# Patient Record
Sex: Female | Born: 1995 | Race: White | Hispanic: No | Marital: Single | Smoking: Never smoker
Health system: Southern US, Community
[De-identification: ages and names within clinical notes are randomized; demographics above are authoritative.]

## PROBLEM LIST (undated history)

## (undated) DIAGNOSIS — R519 Headache, unspecified: Secondary | ICD-10-CM

## (undated) DIAGNOSIS — G43909 Migraine, unspecified, not intractable, without status migrainosus: Secondary | ICD-10-CM

## (undated) DIAGNOSIS — F32A Depression, unspecified: Secondary | ICD-10-CM

## (undated) DIAGNOSIS — J45909 Unspecified asthma, uncomplicated: Secondary | ICD-10-CM

## (undated) DIAGNOSIS — R51 Headache: Secondary | ICD-10-CM

## (undated) DIAGNOSIS — F909 Attention-deficit hyperactivity disorder, unspecified type: Secondary | ICD-10-CM

## (undated) DIAGNOSIS — F329 Major depressive disorder, single episode, unspecified: Secondary | ICD-10-CM

## (undated) HISTORY — DX: Major depressive disorder, single episode, unspecified: F32.9

## (undated) HISTORY — DX: Attention-deficit hyperactivity disorder, unspecified type: F90.9

## (undated) HISTORY — DX: Unspecified asthma, uncomplicated: J45.909

## (undated) HISTORY — DX: Depression, unspecified: F32.A

## (undated) HISTORY — DX: Headache: R51

## (undated) HISTORY — DX: Migraine, unspecified, not intractable, without status migrainosus: G43.909

## (undated) HISTORY — DX: Headache, unspecified: R51.9

---
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
## 2017-02-18 ENCOUNTER — Other Ambulatory Visit (INDEPENDENT_AMBULATORY_CARE_PROVIDER_SITE_OTHER): Payer: Self-pay

## 2017-02-18 ENCOUNTER — Encounter: Payer: Self-pay | Admitting: Nurse Practitioner

## 2017-02-18 ENCOUNTER — Ambulatory Visit (INDEPENDENT_AMBULATORY_CARE_PROVIDER_SITE_OTHER): Payer: 59 | Admitting: Nurse Practitioner

## 2017-02-18 VITALS — BP 122/84 | HR 79 | Temp 98.2°F | Ht 65.0 in | Wt 140.0 lb

## 2017-02-18 DIAGNOSIS — G43709 Chronic migraine without aura, not intractable, without status migrainosus: Secondary | ICD-10-CM | POA: Diagnosis not present

## 2017-02-18 DIAGNOSIS — Z0001 Encounter for general adult medical examination with abnormal findings: Secondary | ICD-10-CM

## 2017-02-18 DIAGNOSIS — J4520 Mild intermittent asthma, uncomplicated: Secondary | ICD-10-CM | POA: Diagnosis not present

## 2017-02-18 DIAGNOSIS — G43909 Migraine, unspecified, not intractable, without status migrainosus: Secondary | ICD-10-CM | POA: Insufficient documentation

## 2017-02-18 DIAGNOSIS — Z13220 Encounter for screening for lipoid disorders: Secondary | ICD-10-CM | POA: Diagnosis not present

## 2017-02-18 DIAGNOSIS — F332 Major depressive disorder, recurrent severe without psychotic features: Secondary | ICD-10-CM

## 2017-02-18 DIAGNOSIS — Z136 Encounter for screening for cardiovascular disorders: Secondary | ICD-10-CM

## 2017-02-18 DIAGNOSIS — J45909 Unspecified asthma, uncomplicated: Secondary | ICD-10-CM | POA: Insufficient documentation

## 2017-02-18 LAB — CBC
HCT: 43.6 % (ref 36.0–46.0)
HEMOGLOBIN: 14.8 g/dL (ref 12.0–15.0)
MCHC: 33.9 g/dL (ref 30.0–36.0)
MCV: 86.4 fl (ref 78.0–100.0)
Platelets: 285 10*3/uL (ref 150.0–400.0)
RBC: 5.05 Mil/uL (ref 3.87–5.11)
RDW: 13.4 % (ref 11.5–15.5)
WBC: 9.3 10*3/uL (ref 4.0–10.5)

## 2017-02-18 LAB — LIPID PANEL
Cholesterol: 161 mg/dL (ref 0–200)
HDL: 49.6 mg/dL (ref >39.00)
LDL Cholesterol: 97 mg/dL (ref 0–99)
NONHDL: 111.6
TRIGLYCERIDES: 75 mg/dL (ref 0.0–149.0)
Total CHOL/HDL Ratio: 3
VLDL: 15 mg/dL (ref 0.0–40.0)

## 2017-02-18 LAB — COMPREHENSIVE METABOLIC PANEL
ALT: 12 U/L (ref 0–35)
AST: 15 U/L (ref 0–37)
Albumin: 5 g/dL (ref 3.5–5.2)
Alkaline Phosphatase: 58 U/L (ref 39–117)
BUN: 12 mg/dL (ref 6–23)
CHLORIDE: 104 meq/L (ref 96–112)
CO2: 24 mEq/L (ref 19–32)
Calcium: 9.9 mg/dL (ref 8.4–10.5)
Creatinine, Ser: 0.78 mg/dL (ref 0.40–1.20)
GFR: 98.34 mL/min (ref >60.00)
GLUCOSE: 90 mg/dL (ref 70–99)
POTASSIUM: 3.7 meq/L (ref 3.5–5.1)
SODIUM: 138 meq/L (ref 135–145)
Total Bilirubin: 0.5 mg/dL (ref 0.2–1.2)
Total Protein: 7.6 g/dL (ref 6.0–8.3)

## 2017-02-18 LAB — TSH: TSH: 2.21 u[IU]/mL (ref 0.35–4.50)

## 2017-02-18 MED ORDER — CITALOPRAM HYDROBROMIDE 40 MG PO TABS: 40.0000 mg | ORAL_TABLET | Freq: Every day | ORAL | 3 refills | Status: DC

## 2017-02-18 NOTE — Patient Instructions (Signed)
I instructed pt to start 1/2 tablet once daily for 1 week and then increase to a full tablet once daily on week two as tolerated.   We discussed common side effects such as nausea, drowsiness and weight gain.  Also discussed rare but serious side effect of suicide ideation.  She is instructed to discontinue medication go directly to ED if this occurs.  Pt verbalizes understanding.   Plan follow up in 1 month to evaluate progress.   Sign medical release to get form from previous pcp.   Health Maintenance, Female Adopting a healthy lifestyle and getting preventive care can go a long way to promote health and wellness. Talk with your health care provider about what schedule of regular examinations is right for you. This is a good chance for you to check in with your provider about disease prevention and staying healthy. In between checkups, there are plenty of things you can do on your own. Experts have done a lot of research about which lifestyle changes and preventive measures are most likely to keep you healthy. Ask your health care provider for more information. Weight and diet Eat a healthy diet  Be sure to include plenty of vegetables, fruits, low-fat dairy products, and lean protein.  Do not eat a lot of foods high in solid fats, added sugars, or salt.  Get regular exercise. This is one of the most important things you can do for your health.  Most adults should exercise for at least 150 minutes each week. The exercise should increase your heart rate and make you sweat (moderate-intensity exercise).  Most adults should also do strengthening exercises at least twice a week. This is in addition to the moderate-intensity exercise. Maintain a healthy weight  Body mass index (BMI) is a measurement that can be used to identify possible weight problems. It estimates body fat based on height and weight. Your health care provider can help determine your BMI and help you achieve or maintain a  healthy weight.  For females 41 years of age and older:  A BMI below 18.5 is considered underweight.  A BMI of 18.5 to 24.9 is normal.  A BMI of 25 to 29.9 is considered overweight.  A BMI of 30 and above is considered obese. Watch levels of cholesterol and blood lipids  You should start having your blood tested for lipids and cholesterol at 22 years of age, then have this test every 5 years.  You may need to have your cholesterol levels checked more often if:  Your lipid or cholesterol levels are high.  You are older than 22 years of age.  You are at high risk for heart disease. Cancer screening Lung Cancer  Lung cancer screening is recommended for adults 49-60 years old who are at high risk for lung cancer because of a history of smoking.  A yearly low-dose CT scan of the lungs is recommended for people who:  Currently smoke.  Have quit within the past 15 years.  Have at least a 30-pack-year history of smoking. A pack year is smoking an average of one pack of cigarettes a day for 1 year.  Yearly screening should continue until it has been 15 years since you quit.  Yearly screening should stop if you develop a health problem that would prevent you from having lung cancer treatment. Breast Cancer  Practice breast self-awareness. This means understanding how your breasts normally appear and feel.  It also means doing regular breast self-exams. Let your health  care provider know about any changes, no matter how small.  If you are in your 20s or 30s, you should have a clinical breast exam (CBE) by a health care provider every 1-3 years as part of a regular health exam.  If you are 63 or older, have a CBE every year. Also consider having a breast X-ray (mammogram) every year.  If you have a family history of breast cancer, talk to your health care provider about genetic screening.  If you are at high risk for breast cancer, talk to your health care provider about having  an MRI and a mammogram every year.  Breast cancer gene (BRCA) assessment is recommended for women who have family members with BRCA-related cancers. BRCA-related cancers include:  Breast.  Ovarian.  Tubal.  Peritoneal cancers.  Results of the assessment will determine the need for genetic counseling and BRCA1 and BRCA2 testing. Cervical Cancer  Your health care provider may recommend that you be screened regularly for cancer of the pelvic organs (ovaries, uterus, and vagina). This screening involves a pelvic examination, including checking for microscopic changes to the surface of your cervix (Pap test). You may be encouraged to have this screening done every 3 years, beginning at age 24.  For women ages 32-65, health care providers may recommend pelvic exams and Pap testing every 3 years, or they may recommend the Pap and pelvic exam, combined with testing for human papilloma virus (HPV), every 5 years. Some types of HPV increase your risk of cervical cancer. Testing for HPV may also be done on women of any age with unclear Pap test results.  Other health care providers may not recommend any screening for nonpregnant women who are considered low risk for pelvic cancer and who do not have symptoms. Ask your health care provider if a screening pelvic exam is right for you.  If you have had past treatment for cervical cancer or a condition that could lead to cancer, you need Pap tests and screening for cancer for at least 20 years after your treatment. If Pap tests have been discontinued, your risk factors (such as having a new sexual partner) need to be reassessed to determine if screening should resume. Some women have medical problems that increase the chance of getting cervical cancer. In these cases, your health care provider may recommend more frequent screening and Pap tests. Colorectal Cancer  This type of cancer can be detected and often prevented.  Routine colorectal cancer screening  usually begins at 22 years of age and continues through 22 years of age.  Your health care provider may recommend screening at an earlier age if you have risk factors for colon cancer.  Your health care provider may also recommend using home test kits to check for hidden blood in the stool.  A small camera at the end of a tube can be used to examine your colon directly (sigmoidoscopy or colonoscopy). This is done to check for the earliest forms of colorectal cancer.  Routine screening usually begins at age 38.  Direct examination of the colon should be repeated every 5-10 years through 22 years of age. However, you may need to be screened more often if early forms of precancerous polyps or small growths are found. Skin Cancer  Check your skin from head to toe regularly.  Tell your health care provider about any new moles or changes in moles, especially if there is a change in a mole's shape or color.  Also tell your health  care provider if you have a mole that is larger than the size of a pencil eraser.  Always use sunscreen. Apply sunscreen liberally and repeatedly throughout the day.  Protect yourself by wearing long sleeves, pants, a wide-brimmed hat, and sunglasses whenever you are outside. Heart disease, diabetes, and high blood pressure  High blood pressure causes heart disease and increases the risk of stroke. High blood pressure is more likely to develop in:  People who have blood pressure in the high end of the normal range (130-139/85-89 mm Hg).  People who are overweight or obese.  People who are African American.  If you are 83-59 years of age, have your blood pressure checked every 3-5 years. If you are 17 years of age or older, have your blood pressure checked every year. You should have your blood pressure measured twice-once when you are at a hospital or clinic, and once when you are not at a hospital or clinic. Record the average of the two measurements. To check your  blood pressure when you are not at a hospital or clinic, you can use:  An automated blood pressure machine at a pharmacy.  A home blood pressure monitor.  If you are between 34 years and 36 years old, ask your health care provider if you should take aspirin to prevent strokes.  Have regular diabetes screenings. This involves taking a blood sample to check your fasting blood sugar level.  If you are at a normal weight and have a low risk for diabetes, have this test once every three years after 22 years of age.  If you are overweight and have a high risk for diabetes, consider being tested at a younger age or more often. Preventing infection Hepatitis B  If you have a higher risk for hepatitis B, you should be screened for this virus. You are considered at high risk for hepatitis B if:  You were born in a country where hepatitis B is common. Ask your health care provider which countries are considered high risk.  Your parents were born in a high-risk country, and you have not been immunized against hepatitis B (hepatitis B vaccine).  You have HIV or AIDS.  You use needles to inject street drugs.  You live with someone who has hepatitis B.  You have had sex with someone who has hepatitis B.  You get hemodialysis treatment.  You take certain medicines for conditions, including cancer, organ transplantation, and autoimmune conditions. Hepatitis C  Blood testing is recommended for:  Everyone born from [birth year] through 1966.  Anyone with known risk factors for hepatitis C. Sexually transmitted infections (STIs)  You should be screened for sexually transmitted infections (STIs) including gonorrhea and chlamydia if:  You are sexually active and are younger than 22 years of age.  You are older than 22 years of age and your health care provider tells you that you are at risk for this type of infection.  Your sexual activity has changed since you were last screened and you are at an  increased risk for chlamydia or gonorrhea. Ask your health care provider if you are at risk.  If you do not have HIV, but are at risk, it may be recommended that you take a prescription medicine daily to prevent HIV infection. This is called pre-exposure prophylaxis (PrEP). You are considered at risk if:  You are sexually active and do not regularly use condoms or know the HIV status of your partner(s).  You take drugs by  injection.  You are sexually active with a partner who has HIV. Talk with your health care provider about whether you are at high risk of being infected with HIV. If you choose to begin PrEP, you should first be tested for HIV. You should then be tested every 3 months for as long as you are taking PrEP. Pregnancy  If you are premenopausal and you may become pregnant, ask your health care provider about preconception counseling.  If you may become pregnant, take 400 to 800 micrograms (mcg) of folic acid every day.  If you want to prevent pregnancy, talk to your health care provider about birth control (contraception). Osteoporosis and menopause  Osteoporosis is a disease in which the bones lose minerals and strength with aging. This can result in serious bone fractures. Your risk for osteoporosis can be identified using a bone density scan.  If you are 69 years of age or older, or if you are at risk for osteoporosis and fractures, ask your health care provider if you should be screened.  Ask your health care provider whether you should take a calcium or vitamin D supplement to lower your risk for osteoporosis.  Menopause may have certain physical symptoms and risks.  Hormone replacement therapy may reduce some of these symptoms and risks. Talk to your health care provider about whether hormone replacement therapy is right for you. Follow these instructions at home:  Schedule regular health, dental, and eye exams.  Stay current with your immunizations.  Do not use  any tobacco products including cigarettes, chewing tobacco, or electronic cigarettes.  If you are pregnant, do not drink alcohol.  If you are breastfeeding, limit how much and how often you drink alcohol.  Limit alcohol intake to no more than 1 drink per day for nonpregnant women. One drink equals 12 ounces of beer, 5 ounces of wine, or 1 ounces of hard liquor.  Do not use street drugs.  Do not share needles.  Ask your health care provider for help if you need support or information about quitting drugs.  Tell your health care provider if you often feel depressed.  Tell your health care provider if you have ever been abused or do not feel safe at home. This information is not intended to replace advice given to you by your health care provider. Make sure you discuss any questions you have with your health care provider. Document Released: 04/13/2011 Document Revised: 03/05/2016 Document Reviewed: 07/02/2015 Elsevier Interactive Patient Education  2017 [employer].
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
## 2017-02-18 NOTE — Progress Notes (Signed)
Pre visit review using our clinic review tool, if applicable. No additional management support is needed unless otherwise documented below in the visit note. 

## 2017-03-04 ENCOUNTER — Telehealth: Payer: Self-pay | Admitting: Nurse Practitioner

## 2017-03-04 NOTE — Telephone Encounter (Signed)
ROI fax to [facility]

## 2017-03-18 ENCOUNTER — Encounter: Payer: Self-pay | Admitting: Nurse Practitioner

## 2017-03-18 ENCOUNTER — Ambulatory Visit (INDEPENDENT_AMBULATORY_CARE_PROVIDER_SITE_OTHER): Payer: 59 | Admitting: Nurse Practitioner

## 2017-03-18 VITALS — BP 98/74 | HR 85 | Temp 97.9°F | Ht 65.0 in | Wt 138.0 lb

## 2017-03-18 DIAGNOSIS — Z23 Encounter for immunization: Secondary | ICD-10-CM

## 2017-03-18 DIAGNOSIS — G478 Other sleep disorders: Secondary | ICD-10-CM | POA: Diagnosis not present

## 2017-03-18 DIAGNOSIS — F332 Major depressive disorder, recurrent severe without psychotic features: Secondary | ICD-10-CM | POA: Diagnosis not present

## 2017-03-18 DIAGNOSIS — F411 Generalized anxiety disorder: Secondary | ICD-10-CM | POA: Diagnosis not present

## 2017-03-18 MED ORDER — CITALOPRAM HYDROBROMIDE 40 MG PO TABS: 40.0000 mg | ORAL_TABLET | Freq: Every day | ORAL | 1 refills | Status: DC

## 2017-03-18 MED ORDER — TEMAZEPAM 7.5 MG PO CAPS: 7.5000 mg | ORAL_CAPSULE | Freq: Every evening | ORAL | 0 refills | Status: DC | PRN

## 2017-03-18 NOTE — Patient Instructions (Addendum)
You will be contacted to schedule appt with behavioral health.  Insomnia Insomnia is a sleep disorder that makes it difficult to fall asleep or to stay asleep. Insomnia can cause tiredness (fatigue), low energy, difficulty concentrating, mood swings, and poor performance at work or school. There are three different ways to classify insomnia:  Difficulty falling asleep.  Difficulty staying asleep.  Waking up too early in the morning.  Any type of insomnia can be long-term (chronic) or short-term (acute). Both are common. Short-term insomnia usually lasts for three months or less. Chronic insomnia occurs at least three times a week for longer than three months. What are the causes? Insomnia may be caused by another condition, situation, or substance, such as:  Anxiety.  Certain medicines.  Gastroesophageal reflux disease (GERD) or other gastrointestinal conditions.  Asthma or other breathing conditions.  Restless legs syndrome, sleep apnea, or other sleep disorders.  Chronic pain.  Menopause. This may include hot flashes.  Stroke.  Abuse of alcohol, tobacco, or illegal drugs.  Depression.  Caffeine.  Neurological disorders, such as Alzheimer disease.  An overactive thyroid (hyperthyroidism).  The cause of insomnia may not be known. What increases the risk? Risk factors for insomnia include:  Gender. Women are more commonly affected than men.  Age. Insomnia is more common as you get older.  Stress. This may involve your professional or personal life.  Income. Insomnia is more common in people with lower income.  Lack of exercise.  Irregular work schedule or night shifts.  Traveling between different time zones.  What are the signs or symptoms? If you have insomnia, trouble falling asleep or trouble staying asleep is the main symptom. This may lead to other symptoms, such as:  Feeling fatigued.  Feeling nervous about going to sleep.  Not feeling rested  in the morning.  Having trouble concentrating.  Feeling irritable, anxious, or depressed.  How is this treated? Treatment for insomnia depends on the cause. If your insomnia is caused by an underlying condition, treatment will focus on addressing the condition. Treatment may also include:  Medicines to help you sleep.  Counseling or therapy.  Lifestyle adjustments.  Follow these instructions at home:  Take medicines only as directed by your health care provider.  Keep regular sleeping and waking hours. Avoid naps.  Keep a sleep diary to help you and your health care provider figure out what could be causing your insomnia. Include: ? When you sleep. ? When you wake up during the night. ? How well you sleep. ? How rested you feel the next day. ? Any side effects of medicines you are taking. ? What you eat and drink.  Make your bedroom a comfortable place where it is easy to fall asleep: ? Put up shades or special blackout curtains to block light from outside. ? Use a white noise machine to block noise. ? Keep the temperature cool.  Exercise regularly as directed by your health care provider. Avoid exercising right before bedtime.  Use relaxation techniques to manage stress. Ask your health care provider to suggest some techniques that may work well for you. These may include: ? Breathing exercises. ? Routines to release muscle tension. ? Visualizing peaceful scenes.  Cut back on alcohol, caffeinated beverages, and cigarettes, especially close to bedtime. These can disrupt your sleep.  Do not overeat or eat spicy foods right before bedtime. This can lead to digestive discomfort that can make it hard for you to sleep.  Limit screen use before bedtime.  This includes: ? Watching TV. ? Using your smartphone, tablet, and computer.  Stick to a routine. This can help you fall asleep faster. Try to do a quiet activity, brush your teeth, and go to bed at the same time each  night.  Get out of bed if you are still awake after 15 minutes of trying to sleep. Keep the lights down, but try reading or doing a quiet activity. When you feel sleepy, go back to bed.  Make sure that you drive carefully. Avoid driving if you feel very sleepy.  Keep all follow-up appointments as directed by your health care provider. This is important. Contact a health care provider if:  You are tired throughout the day or have trouble in your daily routine due to sleepiness.  You continue to have sleep problems or your sleep problems get worse. Get help right away if:  You have serious thoughts about hurting yourself or someone else. This information is not intended to replace advice given to you by your health care provider. Make sure you discuss any questions you have with your health care provider. Document Released: 09/25/2000 Document Revised: 02/28/2016 Document Reviewed: 06/29/2014 Elsevier Interactive Patient Education  [employer].

## 2017-03-18 NOTE — Telephone Encounter (Signed)
Rec'd from [place] forward 31 pages to [facility] NP

## 2017-03-18 NOTE — Progress Notes (Signed)
Subjective:  Patient ID: Shannon Clay, female    DOB: [birth year]  Age: 21 y.o. MRN: [id number]  CC: Follow-up (1 mo fu/med is good--sleepless/pt stated she wants to [place] [facility] scrip now/tdap?)   HPI   Insomnia: Ongoing for several years. No improvement with melatonin in past. Sleeps 8hrs. Takes 1hour to fall asleep. Wakes up at least 2/3times a night.  Anxiety, Depression: Reports improved mood with celexa. Denies any adverse side effects. States she still thinks about death, but has no intention of hurting herself or another person. States the thought of death is not daily, nor is it lingering as before.   Outpatient Medications Prior to Visit  Medication Sig Dispense Refill  . citalopram (CELEXA) 40 MG tablet Take 1 tablet (40 mg total) by mouth daily. 30 tablet 3   No facility-administered medications prior to visit.     ROS See HPI  Objective:  BP 98/74   Pulse 85   Temp 97.9 F (36.6 C)   Ht 5\' 5"  (1.651 m)   Wt 138 lb (62.6 kg)   SpO2 99%   BMI 22.96 kg/m   BP Readings from Last 3 Encounters:  03/18/17 98/74  02/18/17 122/84    Wt Readings from Last 3 Encounters:  03/18/17 138 lb (62.6 kg)  02/18/17 140 lb (63.5 kg)    Physical Exam  Constitutional: She is oriented to person, place, and time.  Neck: Normal range of motion. Neck supple. No thyromegaly present.  Cardiovascular: Normal rate, regular rhythm and normal heart sounds.   Pulmonary/Chest: Effort normal and breath sounds normal.  Abdominal: Soft.  Musculoskeletal: Normal range of motion.  Lymphadenopathy:    She has no cervical adenopathy.  Neurological: She is alert and oriented to person, place, and time.  Vitals reviewed.   Lab Results  Component Value Date   WBC 9.3 02/18/2017   HGB 14.8 02/18/2017   HCT 43.6 02/18/2017   PLT 285.0 02/18/2017   GLUCOSE 90 02/18/2017   CHOL 161 02/18/2017   TRIG 75.0 02/18/2017   HDL 49.60 02/18/2017   LDLCALC 97 02/18/2017   ALT 12  02/18/2017   AST 15 02/18/2017   NA 138 02/18/2017   K 3.7 02/18/2017   CL 104 02/18/2017   CREATININE 0.78 02/18/2017   BUN 12 02/18/2017   CO2 24 02/18/2017   TSH 2.21 02/18/2017    No results found.  Assessment & Plan:   Shannon Clay was seen today for follow-up.  Diagnoses and all orders for this visit:  GAD (generalized anxiety disorder) -     Ambulatory referral to Psychiatry -     temazepam (RESTORIL) 7.5 MG capsule; Take 1 capsule (7.5 mg total) by mouth at bedtime as needed for sleep.  Non-restorative sleep -     temazepam (RESTORIL) 7.5 MG capsule; Take 1 capsule (7.5 mg total) by mouth at bedtime as needed for sleep.  Severe episode of recurrent major depressive disorder, without psychotic features ([place]) -     citalopram (CELEXA) 40 MG tablet; Take 1 tablet (40 mg total) by mouth daily. -     Ambulatory referral to Psychiatry  Need for diphtheria-tetanus-pertussis (Tdap) vaccine -     Tdap vaccine greater than or equal to 7yo IM   I am having Shannon Clay start on temazepam. I am also having her maintain her citalopram.  Meds ordered this encounter  Medications  . citalopram (CELEXA) 40 MG tablet    Sig: Take 1 tablet (40 mg total)  by mouth daily.    Dispense:  90 tablet    Refill:  1    Order Specific Question:   Supervising Provider    Answer:   Cassandria Anger [[id number]]  . temazepam (RESTORIL) 7.5 MG capsule    Sig: Take 1 capsule (7.5 mg total) by mouth at bedtime as needed for sleep.    Dispense:  30 capsule    Refill:  0    Order Specific Question:   Supervising Provider    Answer:   Cassandria Anger [[id number]]    Follow-up: Return in about 4 weeks (around 04/15/2017) for insomnia and anxiety.Wilfred Lacy, NP

## 2017-04-19 ENCOUNTER — Ambulatory Visit: Payer: 59 | Admitting: Nurse Practitioner

## 2017-05-13 ENCOUNTER — Encounter: Payer: Self-pay | Admitting: Neurology

## 2017-05-14 ENCOUNTER — Encounter: Payer: Self-pay | Admitting: Family

## 2017-05-14 ENCOUNTER — Ambulatory Visit: Payer: 59 | Admitting: Family

## 2017-05-14 ENCOUNTER — Ambulatory Visit (INDEPENDENT_AMBULATORY_CARE_PROVIDER_SITE_OTHER): Payer: 59 | Admitting: Family

## 2017-05-14 DIAGNOSIS — S91331A Puncture wound without foreign body, right foot, initial encounter: Secondary | ICD-10-CM | POA: Diagnosis not present

## 2017-05-14 DIAGNOSIS — G478 Other sleep disorders: Secondary | ICD-10-CM

## 2017-05-14 DIAGNOSIS — S91339A Puncture wound without foreign body, unspecified foot, initial encounter: Secondary | ICD-10-CM | POA: Insufficient documentation

## 2017-05-14 MED ORDER — ESZOPICLONE 1 MG PO TABS: 1.0000 mg | ORAL_TABLET | Freq: Every evening | ORAL | 0 refills | Status: DC | PRN

## 2017-05-14 NOTE — Progress Notes (Signed)
Subjective:    Patient ID: Shannon Clay, female    DOB: 1996-08-13, 21 y.o.   MRN: [id number]  Chief Complaint  Patient presents with  . Acute Visit    stepped on a piece of medal at work, was bare foot and punctured right foot     HPI:  Shannon Clay is a 21 y.o. female who  has a past medical history of ADHD (attention deficit hyperactivity disorder); Asthma; Depression; Frequent headaches; and Migraine. and presents today for an acute office visit.   1.) Puncture wound - This is a new problem. Associated symptom of a wound located on his right foot has occurred when he stepped on a piece of metal at work that occurred about 24 hours ago. Describes that he was barefoot and the metal punctured his foot. Denies fevers. Cleansed with soap and water. Does have some pain.  2.) Sleep disturbance - Previously prescribed temazepam. Reports medication as prescribed and notes significant grogginess and sleepiness upon waking in the mornings. Previous treatment failures with over-the-counter melatonin and Tylenol PM. Believes anxiety may be the root cause of symptoms.  Allergies  Allergen Reactions  . Sulfa Antibiotics       Outpatient Medications Prior to Visit  Medication Sig Dispense Refill  . citalopram (CELEXA) 40 MG tablet Take 1 tablet (40 mg total) by mouth daily. 90 tablet 1  . temazepam (RESTORIL) 7.5 MG capsule Take 1 capsule (7.5 mg total) by mouth at bedtime as needed for sleep. (Patient not taking: Reported on 05/14/2017) 30 capsule 0   No facility-administered medications prior to visit.       No past surgical history on file.    Past Medical History:  Diagnosis Date  . ADHD (attention deficit hyperactivity disorder)   . Asthma   . Depression   . Frequent headaches   . Migraine       Review of Systems  Constitutional: Negative for chills and fever.  Skin: Positive for wound. Negative for rash.  Neurological: Negative for weakness and numbness.        Objective:    BP 120/70 (BP Location: Left Arm, Patient Position: Sitting, Cuff Size: Normal)   Pulse 75   Temp 98.4 F (36.9 C) (Oral)   Resp 12   Ht 5\' 5"  (1.651 m)   Wt 142 lb (64.4 kg)   SpO2 99%   BMI 23.63 kg/m  Nursing note and vital signs reviewed.  Physical Exam  Constitutional: She is oriented to person, place, and time. She appears well-developed and well-nourished. No distress.  Cardiovascular: Normal rate, regular rhythm, normal heart sounds and intact distal pulses.   Pulmonary/Chest: Effort normal and breath sounds normal.  Neurological: She is alert and oriented to person, place, and time.  Skin: Skin is warm and dry.  3-4 mm annular puncture wound noted on the planter aspect of the right heel. Wound examined with no evidence of foreign body or material. No redness or edema. Mild tenderness.   Psychiatric: She has a normal mood and affect. Her behavior is normal. Judgment and thought content normal.       Assessment & Plan:   Problem List Items Addressed This Visit      Nervous and Auditory   Non-restorative sleep    Continues to experience non-restorative sleep with temazepam resulting in groggy feelings in the morning. Discontinue temazepam. Trial Lunesta. Encouraged good sleep hygiene. Follow up pending trail of medication.  Other   Puncture wound of heel    New onset puncture wound of heel with no evidence of foreign body or material. No evidence of infection. Tetanus is up to date. Recommend basic wound care with soap and water. Cover for protection. Follow up for signs of infection.           I have discontinued Ms. Behlke's temazepam. I am also having her start on eszopiclone. Additionally, I am having her maintain her citalopram.   Meds ordered this encounter  Medications  . eszopiclone (LUNESTA) 1 MG TABS tablet    Sig: Take 1 tablet (1 mg total) by mouth at bedtime as needed for sleep. Take immediately before bedtime    Dispense:  14  tablet    Refill:  0    Order Specific Question:   Supervising Provider    Answer:   Pricilla Holm A [[id number]]     Follow-up: Return in about 1 month (around 06/14/2017), or if symptoms worsen or fail to improve.  Mauricio Po, FNP

## 2017-05-14 NOTE — Assessment & Plan Note (Signed)
New onset puncture wound of heel with no evidence of foreign body or material. No evidence of infection. Tetanus is up to date. Recommend basic wound care with soap and water. Cover for protection. Follow up for signs of infection.

## 2017-05-14 NOTE — Assessment & Plan Note (Signed)
Continues to experience non-restorative sleep with temazepam resulting in groggy feelings in the morning. Discontinue temazepam. Trial Lunesta. Encouraged good sleep hygiene. Follow up pending trail of medication.

## 2017-05-14 NOTE — Patient Instructions (Signed)
Thank you for choosing [facility].  SUMMARY AND INSTRUCTIONS:  Keep clean with soap and water.  Cover with bandage.  Donut pad as needed.   Follow up with signs of infection.   Follow up:  If your symptoms worsen or fail to improve, please contact our office for further instruction, or in case of emergency go directly to the emergency room at the closest medical facility.

## 2017-05-17 ENCOUNTER — Ambulatory Visit (INDEPENDENT_AMBULATORY_CARE_PROVIDER_SITE_OTHER): Payer: 59 | Admitting: Neurology

## 2017-05-17 ENCOUNTER — Encounter: Payer: Self-pay | Admitting: Neurology

## 2017-05-17 VITALS — BP 108/69 | HR 76 | Ht 65.0 in | Wt 142.0 lb

## 2017-05-17 DIAGNOSIS — G478 Other sleep disorders: Secondary | ICD-10-CM

## 2017-05-17 DIAGNOSIS — F1510 Other stimulant abuse, uncomplicated: Secondary | ICD-10-CM

## 2017-05-17 DIAGNOSIS — G47411 Narcolepsy with cataplexy: Secondary | ICD-10-CM | POA: Diagnosis not present

## 2017-05-17 DIAGNOSIS — R441 Visual hallucinations: Secondary | ICD-10-CM | POA: Diagnosis not present

## 2017-05-17 DIAGNOSIS — F518 Other sleep disorders not due to a substance or known physiological condition: Secondary | ICD-10-CM

## 2017-05-17 DIAGNOSIS — G4719 Other hypersomnia: Secondary | ICD-10-CM | POA: Diagnosis not present

## 2017-05-17 DIAGNOSIS — G4759 Other parasomnia: Secondary | ICD-10-CM | POA: Insufficient documentation

## 2017-05-17 MED ORDER — AMPHETAMINE-DEXTROAMPHETAMINE 10 MG PO TABS: 10.0000 mg | ORAL_TABLET | Freq: Every day | ORAL | 0 refills | Status: DC

## 2017-05-17 NOTE — Patient Instructions (Addendum)
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 We may have to try different medications that may help you stay awake during the day. Not everything works with everybody the same way. Wake promoting agents include stimulants and non-stimulant type medications. The most common side effects with stimulants are weight loss, insomnia, nervousness, headaches, palpitations, rise in blood pressure, anxiety. Stimulants can be addictive and subject to abuse. Non-stimulant type wake promoting medications include Provigil and Nuvigil, most common side effects include headaches, nervousness, insomnia, hypertension. In addition there is a medication called Xyrem which has been proven to be very effective in patients with narcolepsy with or without cataplexy. Some patients with narcolepsy report episodes of weakness, such as jaw or facial weakness, legs giving out, feeling wobbly or like "Jell-o", etc. in situations of anxiety, stress, laughter, sudden sadness, surprise, etc., which is called cataplexy. You can also experience episodes of sleep paralysis during which you may feel unable to move upon awakening. Some people experience dreamlike sequences upon awakening or upon drifting off to sleep, called hypnopompic or hypnagogic hallucinations.  Weaning schedule for MSLT _PSG after weaning off Citalopram. Follow with MSLT.  Weaning schedule - go to 20 mg citalopram for 1 week daily, than 10 mg for one week. Than off for 14 days. MSLT in mid to late September.
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
## 2017-05-17 NOTE — Addendum Note (Signed)
Addended by: Larey Seat on: 05/17/2017 09:54 AM   Modules accepted: Orders

## 2017-06-22 ENCOUNTER — Telehealth: Payer: Self-pay | Admitting: Nurse Practitioner

## 2017-06-22 NOTE — Telephone Encounter (Signed)
Patients mother called asking if any of our MDs do hormone therapy for transgenders. Jonelle Sidle informed me Sharlet Salina and Ronnald Ramp do. The mother would like to know if there are any special requirements expected I.e a psychiatrists, involved or anything like that from the MDs. They would prefer to not get a psychiatrists because of money. Also the patient was told by Baldo Ash she does not do this so they would like to know if you will except the patient as a new patient.  Please advise. She wanted me to ask both MDs and see what both responses were.

## 2017-06-23 NOTE — Telephone Encounter (Signed)
I do supervise and initiate hormonal therapy for patient and would be willing to accept as transfer.

## 2017-06-28 NOTE — Telephone Encounter (Signed)
Mom would like to know if you will need a letter from a psychiatrists and she would also like to know the coding codes for this so she can call her insurance company, please advise.

## 2017-06-29 NOTE — Telephone Encounter (Signed)
They can call insurance company to find out which codes are covered and we can work with that. I'm not sure what note she is talking about from psychiatrist.

## 2017-07-02 NOTE — Telephone Encounter (Signed)
LVM with mother informing of response.

## 2017-07-14 ENCOUNTER — Ambulatory Visit (INDEPENDENT_AMBULATORY_CARE_PROVIDER_SITE_OTHER): Payer: 59 | Admitting: Internal Medicine

## 2017-07-14 ENCOUNTER — Encounter (INDEPENDENT_AMBULATORY_CARE_PROVIDER_SITE_OTHER): Payer: 59 | Admitting: Neurology

## 2017-07-14 ENCOUNTER — Encounter: Payer: Self-pay | Admitting: Internal Medicine

## 2017-07-14 VITALS — BP 110/70 | HR 64 | Temp 98.5°F | Ht 65.0 in | Wt 146.0 lb

## 2017-07-14 DIAGNOSIS — G4719 Other hypersomnia: Secondary | ICD-10-CM

## 2017-07-14 DIAGNOSIS — Z23 Encounter for immunization: Secondary | ICD-10-CM | POA: Diagnosis not present

## 2017-07-14 DIAGNOSIS — F518 Other sleep disorders not due to a substance or known physiological condition: Secondary | ICD-10-CM

## 2017-07-14 DIAGNOSIS — G4733 Obstructive sleep apnea (adult) (pediatric): Secondary | ICD-10-CM

## 2017-07-14 DIAGNOSIS — F1510 Other stimulant abuse, uncomplicated: Secondary | ICD-10-CM

## 2017-07-14 DIAGNOSIS — G47411 Narcolepsy with cataplexy: Secondary | ICD-10-CM

## 2017-07-14 DIAGNOSIS — R441 Visual hallucinations: Secondary | ICD-10-CM

## 2017-07-14 DIAGNOSIS — G4759 Other parasomnia: Secondary | ICD-10-CM

## 2017-07-14 DIAGNOSIS — E349 Endocrine disorder, unspecified: Secondary | ICD-10-CM | POA: Diagnosis not present

## 2017-07-14 DIAGNOSIS — G478 Other sleep disorders: Secondary | ICD-10-CM

## 2017-07-14 MED ORDER — TESTOSTERONE CYPIONATE 200 MG/ML IM SOLN: 100.0000 mg | INTRAMUSCULAR | 0 refills | Status: DC

## 2017-07-14 NOTE — Patient Instructions (Signed)
We have given you the prescription today for the testosterone.   Take 1/2 mL into your muscle once a week for the first month.  We will see you back in about 1 month to check the labs and after that it will be every 2 weeks.

## 2017-07-14 NOTE — Progress Notes (Signed)
   Subjective:    Patient ID: Shannon Clay, female    DOB: 09/02/1996, 21 y.o.   MRN: [id number]  HPI The patient is a 21 YO female to female transgender coming in for new patient initiation of hormone therapy. Lives with family who are supportive. Has employment. Has known for a long time that he was not comfortable in his body and finally realized the gender dysphoria. He is presenting as female but feels that his appearance is not female and wants to go on hormones. Does have some sleeping problems. Some depression with appearance and the fact that people do not see him as female. Denies SI/HI.   PMH, [facility], social history reviewed and updated.   Review of Systems  Constitutional: Negative.   HENT: Negative.   Eyes: Negative.   Respiratory: Negative for cough, chest tightness and shortness of breath.   Cardiovascular: Negative for chest pain, palpitations and leg swelling.  Gastrointestinal: Negative for abdominal distention, abdominal pain, constipation, diarrhea, nausea and vomiting.  Musculoskeletal: Negative.   Skin: Negative.   Neurological: Negative.   Psychiatric/Behavioral: Positive for sleep disturbance. Negative for agitation, behavioral problems, confusion, decreased concentration, dysphoric mood, hallucinations, self-injury and suicidal ideas. The patient is not hyperactive.       Objective:   Physical Exam  Constitutional: She is oriented to person, place, and time. She appears well-developed and well-nourished.  HENT:  Head: Normocephalic and atraumatic.  Eyes: EOM are normal.  Neck: Normal range of motion.  Cardiovascular: Normal rate and regular rhythm.   Pulmonary/Chest: Effort normal and breath sounds normal. No respiratory distress. She has no wheezes. She has no rales.  Abdominal: Soft. Bowel sounds are normal. She exhibits no distension. There is no tenderness. There is no rebound.  Musculoskeletal: She exhibits no edema.  Neurological: She is alert and oriented to  person, place, and time. Coordination normal.  Skin: Skin is warm and dry.  Psychiatric: She has a normal mood and affect.   Vitals:   07/14/17 1040  BP: 110/70  Pulse: 64  Temp: 98.5 F (36.9 C)  TempSrc: Oral  SpO2: 100%  Weight: 146 lb (66.2 kg)  Height: 5\' 5"  (1.651 m)      Assessment & Plan:  Flu shot given at visit.

## 2017-07-15 ENCOUNTER — Ambulatory Visit (INDEPENDENT_AMBULATORY_CARE_PROVIDER_SITE_OTHER): Payer: 59 | Admitting: Neurology

## 2017-07-15 DIAGNOSIS — F1510 Other stimulant abuse, uncomplicated: Secondary | ICD-10-CM

## 2017-07-15 DIAGNOSIS — G47411 Narcolepsy with cataplexy: Secondary | ICD-10-CM | POA: Diagnosis not present

## 2017-07-15 DIAGNOSIS — G478 Other sleep disorders: Secondary | ICD-10-CM

## 2017-07-15 DIAGNOSIS — F518 Other sleep disorders not due to a substance or known physiological condition: Secondary | ICD-10-CM

## 2017-07-15 DIAGNOSIS — G4719 Other hypersomnia: Secondary | ICD-10-CM

## 2017-07-15 DIAGNOSIS — R441 Visual hallucinations: Secondary | ICD-10-CM

## 2017-07-15 DIAGNOSIS — G4759 Other parasomnia: Secondary | ICD-10-CM

## 2017-07-16 DIAGNOSIS — E349 Endocrine disorder, unspecified: Secondary | ICD-10-CM | POA: Insufficient documentation

## 2017-07-16 NOTE — Assessment & Plan Note (Signed)
Rx for testosterone 100 mg weekly for 1 month. Then needs visit with labs. Adjust dosing at that time and then monitoring q 3 months for 1 year then q 6 months. Discussed community resources for support including [facility]. Work environment is supportive and family supportive which is important.

## 2017-07-19 ENCOUNTER — Telehealth: Payer: Self-pay | Admitting: Neurology

## 2017-07-19 NOTE — Telephone Encounter (Signed)
-----   Message from Larey Seat, MD sent at 07/19/2017 10:19 AM EDT ----- Normal PSG followed by MSLT. MSLT was not normal and provided one REM sleep in 5 naps and overall sleep latency was very short ( MSL) . This patient needs treatment for Narcolepsy.   Cc Chevis Pretty, DDS

## 2017-07-19 NOTE — Telephone Encounter (Signed)
Called to discuss sleep study results. No answer and patient voice mail box was full and was unable to leave a message

## 2017-07-19 NOTE — Procedures (Signed)
PATIENT'S NAME:  Shannon Clay, Capano DOB:      1996-07-21      MR#:    [id number]     DATE OF RECORDING: 07/14/2017 REFERRING M.D.:  Chevis Pretty, DDS Study Performed:   Baseline Polysomnogram HISTORY:  Excessive daytime sleepiness, Sleep paralysis, vivid dreams, ADHD, Asthma, Depression, Migraine, Frequent Headaches. The patient endorsed the Epworth Sleepiness Scale at 17/24 points and the FSS at 48.   The patient's weight 142 pounds with a height of 65 (inches), resulting in a BMI of 23.5 kg/m2. The patient's neck circumference measured 13.8 inches.  CURRENT MEDICATIONS: Celexa.   PROCEDURE:  This is a multichannel digital polysomnogram utilizing the Somnostar 11.2 system.  Electrodes and sensors were applied and monitored per AASM Specifications.   EEG, EOG, Chin and Limb EMG, were sampled at 200 Hz.  ECG, Snore and Nasal Pressure, Thermal Airflow, Respiratory Effort, CPAP Flow and Pressure, Oximetry was sampled at 50 Hz. Digital video and audio were recorded.      BASELINE STUDY Lights Out was at 20:47 and Lights On at 05:31.  Total recording time (TRT) was 524.5 minutes, with a total sleep time (TST) of 490.5 minutes.   The patient's sleep latency was 7.5 minutes.  REM latency was 45 minutes.  The sleep efficiency was 93.5 %.     SLEEP ARCHITECTURE: WASO (Wake after sleep onset) was 26 minutes.  There were 11 minutes in Stage N1, 175 minutes Stage N2, 153 minutes Stage N3 and 151.5 minutes in Stage REM.  The percentage of Stage N1 was 2.2%, Stage N2 was 35.7%, Stage N3 was 31.2% and Stage R (REM sleep) was 30.9%.  RESPIRATORY ANALYSIS:  There were a total of 8 respiratory events:  1 obstructive apnea, 2 central apneas and 0 mixed apneas with 5 hypopneas. The patient also had 0 respiratory event related arousals (RERAs).    The total APNEA/HYPOPNEA INDEX (AHI) was 1.0/hour and the total RESPIRATORY DISTURBANCE INDEX was 1.0 /hour.  5 events occurred in REM sleep and 6 events in NREM. The REM AHI  was 2.0 /hour, versus a non-REM AHI of 0.5. The patient spent 490.5 minutes of total sleep time in the supine position and 0 minutes in non-supine. The supine AHI was 1.0 versus a non-supine AHI of 0.0.  OXYGEN SATURATION & C02:  The Wake baseline 02 saturation was 98%, with the lowest being 94%. Time spent below 89% saturation equaled 0 minutes.    PERIODIC LIMB MOVEMENTS:  The patient had a total of 12 Periodic Limb Movements.  The Periodic Limb Movement (PLM) index was 1.5 and the PLM Arousal index was 0.2/hour. The arousals were noted as: 30 were spontaneous, 2 were associated with PLMs, and 3 were associated with respiratory events. Audio and video analysis did not show any abnormal or unusual movements, behaviors, phonations or vocalizations.   EKG was in keeping with normal sinus rhythm (NSR).  Post-study, the patient indicated that sleep was the same as usual.    IMPRESSION: Valid study for MSLT to follow.   1. A follow up appointment will be scheduled in the Sleep Clinic at [facility]. The referring provider will be notified of the results.      I certify that I have reviewed the entire raw data recording prior to the issuance of this report in accordance with the Standards of Accreditation of the [place] [employer] of [employer] Medicine (AASM)      Larey Seat, MD     07-19-2017  Diplomat, Tax adviser of Psychiatry and Neurology  Diplomat, Tax adviser of Sleep Medicine Market researcher, [place] [facility]   Name:  Shannon, Clay Reference [id number]  Study Date: 07/15/2017 Procedure #: [id number]  DOB: 1996/03/27    Protocol: This is a 13 channel Multiple Sleep Latency Test comprised of 5 channels of EEG (T3-Cz, Cz-T4, F4-M1, C4-M1, and O2-M1), 3 channels of Chin EMG, 4 channels of EOG and 1 channel for ECG.   All channels were sampled at 256 hz.  This polysomnographic procedure is designed to evaluate (1) the complaint of excessive daytime sleepiness by  quantifying the time required to fall asleep and (2) the possibility of narcolepsy by checking for abnormally short latencies to REM sleep.  Electrographic variables include EEG, EMG, EOG and ECG.  Patients are monitored throughout four or five 20-minute opportunities to sleep (naps) at two-hour intervals.  For each nap, the patient is allowed 20 minutes to fall asleep.  Once asleep, the patient is awakened after 15 minutes.  Between naps, the patient is kept as alert as possible.  A sleep latency of 20 minutes indicates that no sleep occurred.  Parametric Analysis  Total Number of Naps 5     NAP # Time of Nap  Sleep Latency (mins) REM Latency (mins) Sleep Time Percent Awake Time Percent  1 07:00 7.5 0 68 32   2 08:58 5 0 75  25   3 10:58 2.5 3 85  15   4 12:58 5 0 74  26   5 14:59 3 0 84  16    MSLT Summary of Naps  Sleepiness Index: 77  Mean Sleep Latency to all Five Naps: 4.6  Mean Sleep Latency to First Four Naps: 5  Mean Sleep Latency to First Three Naps: 5  Mean Sleep Latency to First Two Naps: 6.25  Number of Naps with REM Sleep: 1    Results from Preceding PSG Study  Sleep Onset Time 2054 Sleep Efficiency (%) 93.5  Rise Time 0531 Sleep Latency (min) 7.5  Total Sleep Time  8.2 hrs REM Latency (min) 45             I attest to having reviewed every epoch of the entire raw data recording prior to the issuance of this report in accordance with the Standards of the American Academy of Sleep Medicine.    IMPRESSION:  1. This multiple sleep latency test reveals a mean sleep latency of 4.6 minutes with 1 sleep periods during which REM sleep was recorded.  A total of 5 sleep periods were recorded.   2. This study was preceded by an overnight polysomnogram with a total sleep time (TST) of 490.5 minutes and REM latency of 45 minutes.       Name:  [name], Shannon Clay Reference #:  [id number]  Study Date: 07/15/2017 DOB: 01/04/1996    RECOMMENDATIONS: Treatment with  stimulant, Modafinil and/ or XYREM  This MSLT study is abnormal. This study is consistent with severe hypersomnolence at MSL of 4.6 minutes. 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    Larey Seat, M.D.    07-19-2017  Diplomat, American Board of Psychiatry and Neurology  Diplomat, [place] of Sleep Medicine Medical Director, [place] [facility] [place] NAME:  Shannon Clay, Crichlow DOB:      [birth year]      MR#:    [id number]  DATE OF RECORDING: 07/14/2017 REFERRING M.D.:  Chevis Pretty, DDS Study Performed:   Baseline Polysomnogram HISTORY:  Excessive daytime sleepiness, Sleep paralysis, vivid dreams, ADHD, Asthma, Depression, Migraine, Frequent Headaches. The patient endorsed the Epworth Sleepiness Scale at 17/24 points and the FSS at 48.   The patient's weight 142 pounds with a height of 65 (inches), resulting in a BMI of 23.5 kg/m2. The patient's neck circumference measured 13.8 inches.  CURRENT MEDICATIONS: Celexa.   PROCEDURE:  This is a multichannel digital polysomnogram utilizing the Somnostar 11.2 system.  Electrodes and sensors were applied and monitored per AASM Specifications.   EEG, EOG, Chin and Limb EMG, were sampled at 200 Hz.  ECG, Snore and Nasal Pressure, Thermal Airflow, Respiratory Effort, CPAP Flow and Pressure, Oximetry was sampled at 50 Hz. Digital video and audio were recorded.      BASELINE STUDY Lights Out was at 20:47 and Lights On at 05:31.  Total recording time (TRT) was 524.5 minutes, with a total sleep time (TST) of 490.5 minutes.   The patient's sleep latency was 7.5 minutes.  REM latency was 45 minutes.  The sleep efficiency was 93.5 %.     SLEEP ARCHITECTURE: WASO (Wake after sleep onset) was 26 minutes.  There were 11 minutes in Stage N1, 175 minutes Stage N2, 153 minutes Stage N3 and 151.5 minutes in Stage REM.  The percentage of Stage N1 was 2.2%, Stage N2 was  35.7%, Stage N3 was 31.2% and Stage R (REM sleep) was 30.9%.  RESPIRATORY ANALYSIS:  There were a total of 8 respiratory events:  1 obstructive apnea, 2 central apneas and 0 mixed apneas with 5 hypopneas. The patient also had 0 respiratory event related arousals (RERAs).    The total APNEA/HYPOPNEA INDEX (AHI) was 1.0/hour and the total RESPIRATORY DISTURBANCE INDEX was 1.0 /hour.  5 events occurred in REM sleep and 6 events in NREM. The REM AHI was 2.0 /hour, versus a non-REM AHI of 0.5. The patient spent 490.5 minutes of total sleep time in the supine position and 0 minutes in non-supine. The supine AHI was 1.0 versus a non-supine AHI of 0.0.  OXYGEN SATURATION & C02:  The Wake baseline 02 saturation was 98%, with the lowest being 94%. Time spent below 89% saturation equaled 0 minutes.    PERIODIC LIMB MOVEMENTS:  The patient had a total of 12 Periodic Limb Movements.  The Periodic Limb Movement (PLM) index was 1.5 and the PLM Arousal index was 0.2/hour. The arousals were noted as: 30 were spontaneous, 2 were associated with PLMs, and 3 were associated with respiratory events. Audio and video analysis did not show any abnormal or unusual movements, behaviors, phonations or vocalizations.   EKG was in keeping with normal sinus rhythm (NSR).  Post-study, the patient indicated that sleep was the same as usual.    IMPRESSION: Valid study for MSLT to follow.   2. A follow up appointment will be scheduled in the Sleep Clinic at [facility]. The referring provider will be notified of the results.      I certify that I have reviewed the entire raw data recording prior to the issuance of this report in accordance with the Standards of Accreditation of the American Academy of Sleep Medicine (AASM)      Larey Seat, MD     07-19-2017  Diplomat, American Board of Psychiatry and Neurology  Diplomat, American Board of [place] Director, [facility]  [employer]

## 2017-07-21 NOTE — Telephone Encounter (Signed)
Attempted a 2nd time to call the patient. no answer. VM box is still full

## 2017-07-26 ENCOUNTER — Other Ambulatory Visit: Payer: Self-pay | Admitting: Neurology

## 2017-07-26 MED ORDER — MODAFINIL 200 MG PO TABS: 200.0000 mg | ORAL_TABLET | Freq: Every day | ORAL | 3 refills | Status: DC

## 2017-07-26 NOTE — Telephone Encounter (Signed)
Pt returned call. I was able to discuss the sleep study. We will start the patient on modafinil and see if she finds this beneficial. I have asked the patient to keep [place] posted in the meantime if it doesn't work as we were no able to get her in for a follow up apt until Nov 10 2016. Pt verbalized understanding

## 2017-08-18 ENCOUNTER — Other Ambulatory Visit (INDEPENDENT_AMBULATORY_CARE_PROVIDER_SITE_OTHER): Payer: 59

## 2017-08-18 ENCOUNTER — Encounter: Payer: Self-pay | Admitting: Internal Medicine

## 2017-08-18 ENCOUNTER — Ambulatory Visit (INDEPENDENT_AMBULATORY_CARE_PROVIDER_SITE_OTHER): Payer: 59 | Admitting: Internal Medicine

## 2017-08-18 VITALS — BP 116/82 | HR 63 | Temp 98.6°F | Ht 65.0 in | Wt 145.0 lb

## 2017-08-18 DIAGNOSIS — E349 Endocrine disorder, unspecified: Secondary | ICD-10-CM | POA: Diagnosis not present

## 2017-08-18 LAB — TESTOSTERONE: Testosterone: 483.95 ng/dL — ABNORMAL HIGH (ref 15.00–40.00)

## 2017-08-18 MED ORDER — TESTOSTERONE CYPIONATE 200 MG/ML IM SOLN: 100.0000 mg | INTRAMUSCULAR | 3 refills | Status: DC

## 2017-08-18 NOTE — Assessment & Plan Note (Signed)
Checking testosterone levels today, refill given testosterone. Adjust as needed. Needs CBC and testosterone levels in 3 months as well.

## 2017-08-18 NOTE — Patient Instructions (Signed)
We will check the labs today and call you back about the results.    

## 2017-08-18 NOTE — Progress Notes (Signed)
   Subjective:    Patient ID: Shannon Clay, female    DOB: May 06, 1996, 21 y.o.   MRN: [id number]  HPI The patient is a 21 YO man coming in for follow up of hormone imbalance. Has been on testosterone injections for about 1 month, missed 1 last week due to running out. Pharmacy did not fill prescription accurately and only gave 1 mL bottles. Has not noticed a ton of differences in the last month. No negative side effects. Denies problems.   Review of Systems  Constitutional: Negative.   HENT: Negative.   Eyes: Negative.   Respiratory: Negative for cough, chest tightness and shortness of breath.   Cardiovascular: Negative for chest pain, palpitations and leg swelling.  Gastrointestinal: Negative for abdominal distention, abdominal pain, constipation, diarrhea, nausea and vomiting.  Musculoskeletal: Negative.   Skin: Negative.   Neurological: Negative.   Psychiatric/Behavioral: Negative.       Objective:   Physical Exam  Constitutional: She is oriented to person, place, and time. She appears well-developed and well-nourished.  HENT:  Head: Normocephalic and atraumatic.  Eyes: EOM are normal.  Neck: Normal range of motion.  Cardiovascular: Normal rate and regular rhythm.  Pulmonary/Chest: Effort normal and breath sounds normal. No respiratory distress. She has no wheezes. She has no rales.  Abdominal: Soft. Bowel sounds are normal. She exhibits no distension. There is no tenderness. There is no rebound.  Musculoskeletal: She exhibits no edema.  Neurological: She is alert and oriented to person, place, and time. Coordination normal.  Skin: Skin is warm and dry.  Psychiatric: She has a normal mood and affect.   Vitals:   08/18/17 1029  BP: 116/82  Pulse: 63  Temp: 98.6 F (37 C)  TempSrc: Oral  SpO2: 98%  Weight: 145 lb (65.8 kg)  Height: 5\' 5"  (1.651 m)      Assessment & Plan:

## 2017-10-28 DIAGNOSIS — K080 Exfoliation of teeth due to systemic causes: Secondary | ICD-10-CM | POA: Diagnosis not present

## 2017-11-09 ENCOUNTER — Telehealth: Payer: Self-pay | Admitting: Neurology

## 2017-11-09 NOTE — Telephone Encounter (Signed)
Called to make the patient aware that our office will be opening later tomorrow and we will need to change apt. We scheduled the apt on 2/14 at 8:30 am. Pt verbalized understanding of arriving by 8 am for check in

## 2017-11-10 ENCOUNTER — Ambulatory Visit: Payer: Self-pay | Admitting: Neurology

## 2017-11-24 ENCOUNTER — Ambulatory Visit: Payer: 59 | Admitting: Internal Medicine

## 2017-11-25 ENCOUNTER — Ambulatory Visit: Payer: Federal, State, Local not specified - PPO | Admitting: Neurology

## 2017-11-25 ENCOUNTER — Encounter: Payer: Self-pay | Admitting: Neurology

## 2017-11-25 VITALS — BP 115/68 | HR 68 | Ht 64.0 in | Wt 161.0 lb

## 2017-11-25 DIAGNOSIS — G47411 Narcolepsy with cataplexy: Secondary | ICD-10-CM

## 2017-11-25 DIAGNOSIS — Z79899 Other long term (current) drug therapy: Secondary | ICD-10-CM

## 2017-11-25 MED ORDER — MODAFINIL 200 MG PO TABS: 200.0000 mg | ORAL_TABLET | Freq: Every day | ORAL | 3 refills | Status: DC

## 2017-11-25 MED ORDER — CITALOPRAM HYDROBROMIDE 20 MG PO TABS
20.0000 mg | ORAL_TABLET | Freq: Every day | ORAL | 5 refills | Status: DC
Start: 2017-11-25 — End: 2018-04-07

## 2017-11-25 MED ORDER — SODIUM OXYBATE 500 MG/ML PO SOLN: ORAL | 5 refills | Status: DC
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
## 2017-11-25 NOTE — Patient Instructions (Signed)
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 We may have to try different medications that may help you stay awake during the day. Not everything works with everybody the same way. Wake promoting agents include stimulants and non-stimulant type medications. The most common side effects with stimulants are weight loss, insomnia, nervousness, headaches, palpitations, rise in blood pressure, anxiety. Stimulants can be addictive and subject to abuse. Non-stimulant type wake promoting medications include Provigil and Nuvigil, most common side effects include headaches, nervousness, insomnia, hypertension. In addition there is a medication called Xyrem which has been proven to be very effective in patients with narcolepsy with or without cataplexy. Some patients with narcolepsy report episodes of weakness, such as jaw or facial weakness, legs giving out, feeling wobbly or like "Jell-o", etc. in situations of anxiety, stress, laughter, sudden sadness, surprise, etc., which is called cataplexy. You can also experience episodes of sleep paralysis during which you may feel unable to move upon awakening. Some people experience dreamlike sequences upon awakening or upon drifting off to sleep, called hypnopompic or hypnagogic hallucinations.  Medication - XYREM.   As discussed, Xyrem has to be taken with very mindful caution: Taking Xyrem correctly is key. This means, take it only when you are fully ready to fall asleep, while in bed and refrain from doing any other activities, even brushing  your teeth after taking your first dose. The second dose will be about 2-1/2-4 hours after his first dose. You can go to the bathroom before your 2nd dose. Take your first dose, when actually IN BED, ready to sleep. No sitting up in bed, NO reading, NO using the cell phone  or computer, NO getting up to use the bathroom. Take care of everything BEFORE sleep time. Try NOT to skip the second dose as the Xyrem is not going to stay in your system long enough with only one dose. Do not drink alcohol with Xyrem. If you do drink Alcohol, you cannot take your Xyrem doses that night.

## 2017-11-26 ENCOUNTER — Telehealth: Payer: Self-pay | Admitting: *Deleted

## 2017-11-26 LAB — COMPREHENSIVE METABOLIC PANEL
ALT: 18 IU/L (ref 0–32)
AST: 18 IU/L (ref 0–40)
Albumin/Globulin Ratio: 2.5 — ABNORMAL HIGH (ref 1.2–2.2)
Albumin: 4.5 g/dL (ref 3.5–5.5)
Alkaline Phosphatase: 66 IU/L (ref 39–117)
BUN / CREAT RATIO: 17 (ref 9–23)
BUN: 13 mg/dL (ref 6–20)
Bilirubin Total: 0.5 mg/dL (ref 0.0–1.2)
CALCIUM: 9.2 mg/dL (ref 8.7–10.2)
CO2: 23 mmol/L (ref 20–29)
CREATININE: 0.78 mg/dL (ref 0.57–1.00)
Chloride: 106 mmol/L (ref 96–106)
GFR calc Af Amer: 125 mL/min/{1.73_m2} (ref >59)
GFR, EST NON AFRICAN AMERICAN: 108 mL/min/{1.73_m2} (ref >59)
GLOBULIN, TOTAL: 1.8 g/dL (ref 1.5–4.5)
Glucose: 82 mg/dL (ref 65–99)
Potassium: 4.8 mmol/L (ref 3.5–5.2)
SODIUM: 143 mmol/L (ref 134–144)
TOTAL PROTEIN: 6.3 g/dL (ref 6.0–8.5)

## 2017-11-26 NOTE — Telephone Encounter (Signed)
LVM for patient informing her that her labs are all normal except for a mild elevation in albumin. Advised her this has no bearing on XYREM therapy. Advised Dr Brett Fairy stated she can start as directed. This RN stated that the office is closed, left number and advised that if she has any questions about medication she should not begin until she talks to [place], [place] next Monday. Also advised there is a dr on call on weekends.

## 2017-12-01 ENCOUNTER — Telehealth: Payer: Self-pay | Admitting: Neurology

## 2017-12-01 NOTE — Telephone Encounter (Signed)
Pa completed through cover my meds from [facility]. KEY: [id number]. Immediately was approved for coverage for a year

## 2017-12-07 ENCOUNTER — Telehealth: Payer: Self-pay | Admitting: Neurology

## 2017-12-07 NOTE — Telephone Encounter (Signed)
Xyrem approved through [employer]. From 11/07/2017-06/05/2018.

## 2017-12-07 NOTE — Telephone Encounter (Signed)
PA completed for Xyrem through cover my meds. KEY: [id number]  Should have a response within 24 hrs

## 2017-12-24 ENCOUNTER — Encounter: Payer: Self-pay | Admitting: Internal Medicine

## 2017-12-24 ENCOUNTER — Ambulatory Visit (INDEPENDENT_AMBULATORY_CARE_PROVIDER_SITE_OTHER)
Admission: RE | Admit: 2017-12-24 | Discharge: 2017-12-24 | Disposition: A | Discharge status: Home / Self Care | Payer: Federal, State, Local not specified - PPO | Source: Ambulatory Visit | Attending: Internal Medicine | Admitting: Internal Medicine

## 2017-12-24 ENCOUNTER — Ambulatory Visit: Payer: Federal, State, Local not specified - PPO | Admitting: Internal Medicine

## 2017-12-24 VITALS — BP 110/70 | HR 92 | Temp 98.1°F | Ht 64.0 in | Wt 155.0 lb

## 2017-12-24 DIAGNOSIS — M79644 Pain in right finger(s): Secondary | ICD-10-CM | POA: Diagnosis not present

## 2017-12-24 DIAGNOSIS — S6991XA Unspecified injury of right wrist, hand and finger(s), initial encounter: Secondary | ICD-10-CM | POA: Diagnosis not present

## 2017-12-24 MED ORDER — TRAMADOL HCL 50 MG PO TABS: 50.0000 mg | ORAL_TABLET | Freq: Three times a day (TID) | ORAL | 0 refills | Status: DC | PRN

## 2017-12-24 NOTE — Assessment & Plan Note (Addendum)
X-ray ordered as suspicious for fracture. Rx for tramadol for pain and continue ibuprofen and ice.

## 2017-12-24 NOTE — Progress Notes (Signed)
   Subjective:    Patient ID: Shannon Clay, female    DOB: 1996-03-17, 22 y.o.   MRN: [id number]  HPI The patient is a 22 YO man coming in for right thumb injury at work. It was caught in a very heavy door. Hit the last joint on the thumb close to the wrist. He has been taking ibuprofen without relief. Tried some ice which did not help. Still swollen and pain 6/10. Can move but very limited ROM.   Review of Systems  Constitutional: Positive for activity change. Negative for appetite change, chills, fatigue, fever and unexpected weight change.  Respiratory: Negative.   Cardiovascular: Negative.   Gastrointestinal: Negative.   Musculoskeletal: Positive for arthralgias and myalgias. Negative for back pain, gait problem and joint swelling.  Skin: Negative.   Neurological: Negative.       Objective:   Physical Exam  Constitutional: She is oriented to person, place, and time. She appears well-developed and well-nourished.  HENT:  Head: Normocephalic and atraumatic.  Eyes: EOM are normal.  Neck: Normal range of motion.  Cardiovascular: Normal rate and regular rhythm.  Pulmonary/Chest: Effort normal and breath sounds normal. No respiratory distress. She has no wheezes. She has no rales.  Abdominal: Soft. She exhibits no distension. There is no tenderness. There is no rebound.  Musculoskeletal: She exhibits edema and tenderness.  Pain at the mcp joint with swelling and small wound  Neurological: She is alert and oriented to person, place, and time. Coordination normal.  Skin: Skin is warm and dry.   Vitals:   12/24/17 0926  BP: 110/70  Pulse: 92  Temp: 98.1 F (36.7 C)  TempSrc: Oral  SpO2: 98%  Weight: 155 lb (70.3 kg)  Height: 5\' 4"  (1.626 m)      Assessment & Plan:

## 2017-12-24 NOTE — Patient Instructions (Addendum)
We will x-ray the right hand today and call you back with the results.   We have sent in tramadol to take as needed up to 3 times per day.   It is okay to keep taking ibuprofen as well for the pain and swelling. You can use ice as well.

## 2017-12-27 ENCOUNTER — Telehealth: Payer: Self-pay | Admitting: Internal Medicine

## 2017-12-27 NOTE — Telephone Encounter (Signed)
Copied from [place]. Topic: Quick Communication - Rx Refill/Question >> Dec 27, 2017  1:49 PM Waylan Rocher, [place] L wrote: Medication: tramadol  Has the patient contacted their pharmacy? No. (new request, pt turned it down initially)  (Agent: If no, request that the patient contact the pharmacy for the refill.)  Preferred Pharmacy (with phone number or street name): [place], [place].  Agent: Please be advised that RX refills may take up to 3 business days. We ask that you follow-up with your pharmacy.

## 2017-12-28 NOTE — Telephone Encounter (Signed)
Call to patient - let patient know that [facility]. She will check there for Rx.

## 2018-01-05 ENCOUNTER — Ambulatory Visit: Payer: Federal, State, Local not specified - PPO | Admitting: Family Medicine

## 2018-01-05 ENCOUNTER — Encounter: Payer: Self-pay | Admitting: Family Medicine

## 2018-01-05 ENCOUNTER — Ambulatory Visit: Payer: Self-pay | Admitting: *Deleted

## 2018-01-05 DIAGNOSIS — M79644 Pain in right finger(s): Secondary | ICD-10-CM | POA: Diagnosis not present

## 2018-01-05 NOTE — Telephone Encounter (Signed)
fyi

## 2018-01-05 NOTE — Telephone Encounter (Signed)
Shannon Clay dislocated her RT thumb, MCP joint  last week. She was  Seen by Dr. Sharlet Salina and reports that she manipulated the thumb to align the joint. She felt a pop in the thumb yesterday and thinks it could be out of place again. Stated she used ice and also tried the Tramadol for poin. However, she has Narcolepsy and cannot tolerate even a half dose of the Tramadol and continue to work as a Psychologist, clinical. Technician. We discussed splinting the thumb to the forefinger but she replied she didn't think that was possible. She also stated advil doesn't help with the pain. Appointment made at [facility] as no availability at PCP. Reason for Disposition . Looks like a dislocated joint (e.g., crooked or deformed)  Answer Assessment - Initial Assessment Questions 1. MECHANISM: "How did the injury happen?"      Injured Rt thumb last week at work, dislocated it.  2. ONSET: "When did the injury happen?" (Minutes or hours ago)      Slammed in a door at work. 3. LOCATION: "What part of the finger is injured?" "Is the nail damaged?"      Last joint on thumb 4. APPEARANCE of the INJURY: "What does the injury look like?"     Slightly swollen now 5. SEVERITY: "Can you use the hand normally?"  "Can you bend your fingers into a ball and then fully open them?"   Severe pain to move the tip of thumb from the last joint. 6. SIZE: For cuts, bruises, or swelling, ask: "How large is it?" (e.g., inches or centimeters;  entire finger)    none 7. PAIN: "Is there pain?" If so, ask: "How bad is the pain?"    (e.g., Scale 1-10; or mild, moderate, severe)     Moderate, 6-on the scale  8. TETANUS: For any breaks in the skin, ask: "When was the last tetanus booster?"   no 9. OTHER SYMPTOMS: "Do you have any other symptoms?" no 10. PREGNANCY: "Is there any chance you are pregnant?" "When was your last menstrual period?"      no  Protocols used: FINGER INJURY-A-AH

## 2018-01-05 NOTE — Patient Instructions (Signed)
Use splint as needed- I would recommend a thumb spica splint available at medical supply store Ibuprofen/tylenol as needed for pain control since tramadol makes you drowsy.  Ice as tolerated Work on range of motion and pincher grasp exercises Follow up with Dr. Sharlet Salina as needed.

## 2018-01-05 NOTE — Progress Notes (Signed)
   Subjective:    Patient ID: Shannon Clay, female    DOB: 1995-10-21, 22 y.o.   MRN: [id number]  Chief Complaint  Patient presents with  . Acute Visit    right thumb pain     HPI HPI:  22 yo female here today with complaint of R thumb pain.  Initial injury occurred about two weeks ago after slamming finger in door at work.  Was seen with pcp and he states he was told that thumb was dislocated.  States that joint was manipulated and xrays obtained.  Xray report reviewed and I also reviewed images independently today showing no fracture and/or dislocation.  He states that he felt a pop at work yesterday and was concerned that he dislocated the thumb again.  Having some mild increased pain since this happened. Still with reduced ROM and mild difficulty with pincher grasp.  Has some mild swelling as well.  Has tried tramadol however this makes him too drowsy, even at half strength.  Does not have numbness, tingling.     Review of Systems ROS per HPI, otherwise negative.     Objective:   Physical Exam  Constitutional: She is oriented to person, place, and time. She appears well-nourished. No distress.  Pulmonary/Chest: Effort normal and breath sounds normal.  Musculoskeletal:  Bruising noted at base of thumb/cmc. No deformity noted.  ROM of thumb is WNL but a bit painful.  Strength is pretty good, however somewhat diminished due to pain as well.  No significant instability to valgus/varus stress.  No dorsal subluxation/dislocation.   Neurological: She is alert and oriented to person, place, and time.  Psychiatric: She has a normal mood and affect. Her behavior is normal.          Assessment & Plan:  Pain of right thumb Reassured that thumb did not appear to be dislocated at this time.  Recommend ibuprofen/tylenol as needed for pain control.  Localized icing as tolerated and begin to work on ROM exercises.  Recommend short term use of thumb spica splint as needed for work for comfort.  F/u  with PCP if not improving as expected.

## 2018-01-05 NOTE — Assessment & Plan Note (Signed)
Reassured that thumb did not appear to be dislocated at this time.  Recommend ibuprofen/tylenol as needed for pain control.  Localized icing as tolerated and begin to work on ROM exercises.  Recommend short term use of thumb spica splint as needed for work for comfort.  F/u with PCP if not improving as expected.

## 2018-01-06 NOTE — Telephone Encounter (Signed)
Copied from [place] [id number]. Topic: Inquiry >> Jan 05, 2018  4:52 PM Arletha Grippe wrote: Reason for CRM: pt is asking for a letter for Thursday and Friday off work to dislocated digit.  Tramadol is not helping. Cb is [phone]  Left a VM for patient to give the office a call back regarding needing a letter for work and medication not working. Per Dr. Zigmund Daniel okay to provide letter for patient and patient can take 800 mg Ibuprofen every 8 hours for pain.

## 2018-01-27 ENCOUNTER — Other Ambulatory Visit: Payer: Self-pay | Admitting: Internal Medicine

## 2018-01-27 NOTE — Telephone Encounter (Signed)
Copied from [place]. Topic: Quick Communication - Rx Refill/Question >> Jan 27, 2018  1:28 PM [place], [place] A wrote: Medication: testosterone cypionate (DEPOTESTOSTERONE CYPIONATE) 200 MG/ML injection [[id number] citalopram (CELEXA) 20 MG tablet [[id number]]  Has the patient contacted their pharmacy? {no  (Agent: If no, request that the patient contact the pharmacy for the refill.) Preferred Pharmacy (with phone number or street name): [facility] [address]  Agent: Please be advised that RX refills may take up to 3 business days. We ask that you follow-up with your pharmacy.

## 2018-01-27 NOTE — Telephone Encounter (Signed)
Rx refill request: testosterone cypionate  LOV: 08/18/17  PCP: [place]: verified    Celexa Rx'd by another provider

## 2018-03-30 ENCOUNTER — Encounter: Payer: Self-pay | Admitting: Adult Health

## 2018-03-30 ENCOUNTER — Ambulatory Visit: Payer: Federal, State, Local not specified - PPO | Admitting: Adult Health

## 2018-03-30 ENCOUNTER — Ambulatory Visit: Payer: Federal, State, Local not specified - PPO | Admitting: Internal Medicine

## 2018-03-30 VITALS — BP 108/66 | HR 65 | Ht 64.0 in | Wt 168.2 lb

## 2018-03-30 DIAGNOSIS — G47411 Narcolepsy with cataplexy: Secondary | ICD-10-CM | POA: Diagnosis not present

## 2018-03-30 MED ORDER — MODAFINIL 200 MG PO TABS: 200.0000 mg | ORAL_TABLET | Freq: Every day | ORAL | 5 refills | Status: DC

## 2018-03-30 NOTE — Patient Instructions (Signed)
Your Plan:  restart Provigil  If your symptoms worsen or you develop new symptoms please let us know.    Thank you for coming to see [place] at [facility]. I hope we have been able to provide you high quality care today.  You may receive a patient satisfaction survey over the next few weeks. We would appreciate your feedback and comments so that we may continue to improve ourselves and the health of our patients.

## 2018-03-30 NOTE — Progress Notes (Signed)
PATIENT: Shannon Clay DOB: 01-Dec-1995  REASON FOR VISIT: follow up HISTORY FROM: patient  HISTORY OF PRESENT ILLNESS: Today 03/30/18 Shannon Clay is a 22 year old female with a history of narcolepsy.  She returns today for follow-up.  She reports that Xyrem was too expensive.  She reports that she has been on Provigil but was unable to get a prescription this last month so she has been off the medication for 1 month.  She reports that Provigil was beneficial but did not completely resolve her daytime sleepiness.  She denies falling asleep while at work or driving.  She states that Provigil is definitely better than not taking anything at all.  She returns today for an evaluation.  HISTORY 11-25-2017 (copied from Dr. Edwena Felty note): I have the pleasure of meeting today with Shannon Clay, following a polysomnography from 14 July 2017 and an M SLT from 15 July 2017.  The PSG did surprisingly not identify any organic sleep disorder, total sleep time was 8.2 hours REM latency and minutes was 45, sleep latency 7.5 minutes and sleep efficiency was 93.5% M SLT following the study showed 5 sleep.  During each sleep was recorded during the third sleep.  REM sleep was recorded.  The conclusion from the very short mean sleep latency of 5 naps was 4.6 minutes.  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 The patient started on modafinil and feels that it has made sleepiness more manageable, without taking the sleepiness truly away.  Today's fatigue severity scale was endorsed at 38 points, Epworth Sleepiness Scale is still endorsed at 17 out of 24 points.  The patient is excessively going after having taken modafinil just within the last 2 hours. The patient reports having fallen asleep at her workplace.  Given these clinical descriptions I think we need to switch to a different class  of medications.  I would like to start her on Xyrem.  The patient lives at home does not live alone which is a benefit when starting this medication.  There will be a 3-week titration.  Before she reaches a therapeutic level.  I will write the Xyrem prescription beginning at 2.75 g twice at night followed by 3.5 g twice at night followed by 4 g twice at night.  If she has a partial response but incomplete response I would go as far as 4.5 g twice at night.  The patient will also receive some additional education about the drug today.    REVIEW OF SYSTEMS: Out of a complete 14 system review of symptoms, the patient complains only of the following symptoms, and all other reviewed systems are negative.  See HPI  ALLERGIES: Allergies  Allergen Reactions  . Latex Swelling    Swelling   . Sulfa Antibiotics     HOME MEDICATIONS: Outpatient Medications Prior to Visit  Medication Sig Dispense Refill  . citalopram (CELEXA) 20 MG tablet Take 1 tablet (20 mg total) by mouth daily. 30 tablet 5  . modafinil (PROVIGIL) 200 MG tablet Take 1 tablet (200 mg total) by mouth daily. 30 tablet 3  . Sodium Oxybate (XYREM) 500 MG/ML SOLN Start with 2.25 gram at bedtime and a second dose 2-3 hours later for 8 days. Increase to 3.0g rams bid for 8 days. Increase to 3. 75 grams bid and if tolerated to 4.5 grams bid. (Patient not taking: Reported on  01/05/2018) 270 mL 5  . testosterone cypionate (DEPOTESTOSTERONE CYPIONATE) 200 MG/ML injection Inject 0.5 mLs (100 mg total) once a week into the muscle. 10 mL 3  . traMADol (ULTRAM) 50 MG tablet Take 1 tablet (50 mg total) by mouth every 8 (eight) hours as needed. 30 tablet 0   No facility-administered medications prior to visit.     PAST MEDICAL HISTORY: Past Medical History:  Diagnosis Date  . ADHD (attention deficit hyperactivity disorder)   . Asthma   . Depression   . Frequent headaches   . Migraine     PAST SURGICAL HISTORY: No past surgical history on  file.  FAMILY HISTORY: Family History  Problem Relation Age of Onset  . ADD / ADHD Mother   . Anxiety disorder Mother   . Mental illness Mother   . OCD Father   . Anxiety disorder Sister   . Depression Sister   . Depression Paternal Aunt   . Arthritis Maternal Grandmother     SOCIAL HISTORY: Social History   Socioeconomic History  . Marital status: Single    Spouse name: Not on file  . Number of children: Not on file  . Years of education: Not on file  . Highest education level: Not on file  Occupational History  . Not on file  Social Needs  . Financial resource strain: Not on file  . Food insecurity:    Worry: Not on file    Inability: Not on file  . Transportation needs:    Medical: Not on file    Non-medical: Not on file  Tobacco Use  . Smoking status: Never Smoker  . Smokeless tobacco: Never Used  Substance and Sexual Activity  . Alcohol use: No    Comment: social  . Drug use: No  . Sexual activity: Never  Lifestyle  . Physical activity:    Days per week: Not on file    Minutes per session: Not on file  . Stress: Not on file  Relationships  . Social connections:    Talks on phone: Not on file    Gets together: Not on file    Attends religious service: Not on file    Active member of club or organization: Not on file    Attends meetings of clubs or organizations: Not on file    Relationship status: Not on file  . Intimate partner violence:    Fear of current or ex partner: Not on file    Emotionally abused: Not on file    Physically abused: Not on file    Forced sexual activity: Not on file  Other Topics Concern  . Not on file  Social History Narrative  . Not on file      PHYSICAL EXAM  Vitals:   03/30/18 0813  BP: 108/66  Pulse: 65  Weight: 168 lb 3.2 oz (76.3 kg)  Height: 5\' 4"  (1.626 m)   Body mass index is 28.87 kg/m.  Generalized: Well developed, in no acute distress   Neurological examination  Mentation: Alert oriented to  time, place, history taking. Follows all commands speech and language fluent Cranial nerve II-XII: Pupils were equal round reactive to light. Extraocular movements were full, visual field were full on confrontational test. Facial sensation and strength were normal. Uvula tongue midline. Head turning and shoulder shrug  were normal and symmetric. Motor: The motor testing reveals 5 over 5 strength of all 4 extremities. Good symmetric motor tone is noted throughout.  Sensory: Sensory testing  is intact to soft touch on all 4 extremities. No evidence of extinction is noted.  Coordination: Cerebellar testing reveals good finger-nose-finger and heel-to-shin bilaterally.  Gait and station: Gait is normal.  Reflexes: Deep tendon reflexes are symmetric and normal bilaterally.   DIAGNOSTIC DATA (LABS, IMAGING, TESTING) - I reviewed patient records, labs, notes, testing and imaging myself where available.  Lab Results  Component Value Date   WBC 9.3 02/18/2017   HGB 14.8 02/18/2017   HCT 43.6 02/18/2017   MCV 86.4 02/18/2017   PLT 285.0 02/18/2017      Component Value Date/Time   NA 143 11/25/2017 0921   K 4.8 11/25/2017 0921   CL 106 11/25/2017 0921   CO2 23 11/25/2017 0921   GLUCOSE 82 11/25/2017 0921   GLUCOSE 90 02/18/2017 0924   BUN 13 11/25/2017 0921   CREATININE 0.78 11/25/2017 0921   CALCIUM 9.2 11/25/2017 0921   PROT 6.3 11/25/2017 0921   ALBUMIN 4.5 11/25/2017 0921   AST 18 11/25/2017 0921   ALT 18 11/25/2017 0921   ALKPHOS 66 11/25/2017 0921   BILITOT 0.5 11/25/2017 0921   GFRNONAA 108 11/25/2017 0921   GFRAA 125 11/25/2017 0921   Lab Results  Component Value Date   CHOL 161 02/18/2017   HDL 49.60 02/18/2017   LDLCALC 97 02/18/2017   TRIG 75.0 02/18/2017   CHOLHDL 3 02/18/2017   No results found for: HGBA1C No results found for: VITAMINB12 Lab Results  Component Value Date   TSH 2.21 02/18/2017      ASSESSMENT AND PLAN 22 y.o. year old female  has a past  medical history of ADHD (attention deficit hyperactivity disorder), Asthma, Depression, Frequent headaches, and Migraine. here with:   1.  Narcolepsy with cataplexy  The patient cannot afford Xyrem.  She will continue on Provigil 200 mg daily.  Advised that if she finds this is not beneficial she will let us know.  She will follow-up in 6 months or sooner if needed.  I spent 15 minutes with the patient. 50% of this time was spent discussing her medication   Ward Givens, MSN, NP-C 03/30/2018, 7:23 AM [facility] [address] [place], [place] [address] [phone]

## 2018-04-01 NOTE — Progress Notes (Signed)
I agree with the assessment and plan as directed by NP .The patient is known to me .   Criselda Starke, MD  

## 2018-04-07 ENCOUNTER — Ambulatory Visit: Payer: Federal, State, Local not specified - PPO | Admitting: Internal Medicine

## 2018-04-07 ENCOUNTER — Encounter: Payer: Self-pay | Admitting: Internal Medicine

## 2018-04-07 ENCOUNTER — Other Ambulatory Visit (INDEPENDENT_AMBULATORY_CARE_PROVIDER_SITE_OTHER): Payer: Federal, State, Local not specified - PPO

## 2018-04-07 VITALS — BP 100/70 | HR 76 | Temp 98.6°F | Ht 64.0 in | Wt 165.0 lb

## 2018-04-07 DIAGNOSIS — D2230 Melanocytic nevi of unspecified part of face: Secondary | ICD-10-CM

## 2018-04-07 DIAGNOSIS — E349 Endocrine disorder, unspecified: Secondary | ICD-10-CM | POA: Diagnosis not present

## 2018-04-07 LAB — CBC
HEMATOCRIT: 42.5 % (ref 36.0–46.0)
HEMOGLOBIN: 14.7 g/dL (ref 12.0–15.0)
MCHC: 34.5 g/dL (ref 30.0–36.0)
MCV: 87.3 fl (ref 78.0–100.0)
PLATELETS: 299 10*3/uL (ref 150.0–400.0)
RBC: 4.87 Mil/uL (ref 3.87–5.11)
RDW: 12.8 % (ref 11.5–15.5)
WBC: 9.7 10*3/uL (ref 4.0–10.5)

## 2018-04-07 MED ORDER — TESTOSTERONE 4 MG/24HR TD PT24: 1.0000 | MEDICATED_PATCH | Freq: Every day | TRANSDERMAL | 3 refills | Status: DC

## 2018-04-07 MED ORDER — CITALOPRAM HYDROBROMIDE 20 MG PO TABS: 20.0000 mg | ORAL_TABLET | Freq: Every day | ORAL | 3 refills | Status: DC

## 2018-04-07 MED ORDER — TESTOSTERONE CYPIONATE 200 MG/ML IM SOLN: 100.0000 mg | INTRAMUSCULAR | 3 refills | Status: DC

## 2018-04-07 NOTE — Patient Instructions (Signed)
We have sent in the refills.   We have sent in both the vials and the patch of the testosterone to see if the patch is affordable.   We will check the labs today.

## 2018-04-07 NOTE — Progress Notes (Signed)
   Subjective:    Patient ID: Shannon Clay, female    DOB: [birth year], 22 y.o.   MRN: [id number]  HPI The patient is a 22 YO FTM coming in for refills on testosterone. Was taking 100 mg weekly and having some problems with doing injections. Is out of testosterone for some weeks now and wants to get back on. Denies problems with symptoms or side effects. Had normal labs on testosterone after starting. Denies aggression or acne. Some voice deepening and facial hair which is receding now since out of testosterone.   Review of Systems  Constitutional: Negative.   HENT: Negative.   Eyes: Negative.   Respiratory: Negative for cough, chest tightness and shortness of breath.   Cardiovascular: Negative for chest pain, palpitations and leg swelling.  Gastrointestinal: Negative for abdominal distention, abdominal pain, constipation, diarrhea, nausea and vomiting.  Musculoskeletal: Negative.   Skin: Negative.   Neurological: Negative.   Psychiatric/Behavioral: Negative.       Objective:   Physical Exam  Constitutional: She is oriented to person, place, and time. She appears well-developed and well-nourished.  HENT:  Head: Normocephalic and atraumatic.  Eyes: EOM are normal.  Neck: Normal range of motion.  Cardiovascular: Normal rate and regular rhythm.  Pulmonary/Chest: Effort normal and breath sounds normal. No respiratory distress. She has no wheezes. She has no rales.  Abdominal: Soft. Bowel sounds are normal. She exhibits no distension. There is no tenderness. There is no rebound.  Musculoskeletal: She exhibits no edema.  Neurological: She is alert and oriented to person, place, and time. Coordination normal.  Skin: Skin is warm and dry.  Psychiatric: She has a normal mood and affect.   Vitals:   04/07/18 1545  BP: 100/70  Pulse: 76  Temp: 98.6 F (37 C)  TempSrc: Oral  SpO2: 98%  Weight: 165 lb (74.8 kg)  Height: 5\' 4"  (1.626 m)      Assessment & Plan:

## 2018-04-08 NOTE — Assessment & Plan Note (Signed)
Wants to switch to patch daily to avoid having to give injection weekly. Will refill testosterone, checking levels and Hg today. Also rx for patch to see if financially affordable.

## 2018-04-09 LAB — TESTOSTERONE, FREE, TOTAL, SHBG
SEX HORMONE BINDING: 68.9 nmol/L (ref 24.6–122.0)
TESTOSTERONE FREE: 5.2 pg/mL — AB (ref 0.0–4.2)
TESTOSTERONE: 59 ng/dL — AB (ref 8–48)

## 2018-04-13 ENCOUNTER — Telehealth: Payer: Self-pay

## 2018-04-13 NOTE — Telephone Encounter (Signed)
PA started on CoverMyMeds KEY: [id number]

## 2018-09-07 ENCOUNTER — Encounter: Payer: Self-pay | Admitting: Family

## 2018-09-07 ENCOUNTER — Ambulatory Visit: Payer: Federal, State, Local not specified - PPO | Admitting: Family

## 2018-09-07 DIAGNOSIS — R519 Headache, unspecified: Secondary | ICD-10-CM

## 2018-09-07 DIAGNOSIS — R51 Headache: Secondary | ICD-10-CM | POA: Diagnosis not present

## 2018-09-07 MED ORDER — KETOROLAC TROMETHAMINE 30 MG/ML IJ SOLN
30.0000 mg | Freq: Once | INTRAMUSCULAR | Status: AC
  Administered 2018-09-07: 30 mg via INTRAMUSCULAR

## 2018-09-07 NOTE — Progress Notes (Signed)
Shannon Clay is a 22 y.o. female with the following history as recorded in EpicCare:  Patient Active Problem List   Diagnosis Date Noted  . Pain of right thumb 12/24/2017  . Hormone imbalance 07/16/2017  . 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 Abnormal dreams 05/17/2017  . Sleep-related hallucinations 05/17/2017  . Excessive daytime sleepiness 05/17/2017  . Caffeine abuse, continuous ([place]) 05/17/2017  . Puncture wound of heel 05/14/2017  . Non-restorative sleep 03/18/2017  . Asthma 02/18/2017  . Migraine 02/18/2017  . Severe episode of recurrent major depressive disorder, without psychotic features ([place]) 02/18/2017  . ADHD (attention deficit hyperactivity disorder) 10/19/2011  . GAD (generalized anxiety disorder) 10/19/2011    Current Outpatient Medications  Medication Sig Dispense Refill  . citalopram (CELEXA) 20 MG tablet Take 1 tablet (20 mg total) by mouth daily. 90 tablet 3  . modafinil (PROVIGIL) 200 MG tablet Take 1 tablet (200 mg total) by mouth daily. 30 tablet 5  . testosterone cypionate (DEPOTESTOSTERONE CYPIONATE) 200 MG/ML injection Inject 0.5 mLs (100 mg total) into the muscle every 14 (fourteen) days. 10 mL 3   No current facility-administered medications for this visit.     Allergies: Latex and Sulfa antibiotics  Past Medical History:  Diagnosis Date  . ADHD (attention deficit hyperactivity disorder)   . Asthma   . Depression   . Frequent headaches   . Migraine     History reviewed. No pertinent surgical history.  Family History  Problem Relation Age of Onset  . ADD / ADHD Mother   . Anxiety disorder Mother   . Mental illness Mother   . OCD Father   . Anxiety disorder Sister   . Depression Sister   . Depression Paternal Aunt   . Arthritis Maternal Grandmother     Social History   Tobacco Use  . Smoking status: Never Smoker  . Smokeless tobacco: Never Used  Substance Use Topics  . Alcohol use: No    Comment: social    Subjective:  Presents  with concerns for migraine headache; symptoms started yesterday- nausea, loss of vision, arm pain; does have history of migraine headaches; does feel like this particular headache may have been one of the worst headaches he has ever experienced.  History of migraines for 5+ years- started around age 38; may have 1-2 migraines/ month; notes that does typically have ophthalmic symptoms with the migraines;  Is feeling slightly better today- vision is normal; able to drive to work today, able to work today; no loss of consciousness;     Objective:  Vitals:   09/07/18 1445  BP: 110/88  Pulse: 94  Temp: 98.6 F (37 C)  TempSrc: Oral  SpO2: 96%  Weight: 182 lb 1.9 oz (82.6 kg)  Height: 5\' 4"  (1.626 m)    General: Well developed, well nourished, in no acute distress  Skin : Warm and dry.  Head: Normocephalic and atraumatic  Eyes: Sclera and conjunctiva clear; pupils round and reactive to light; extraocular movements intact  Ears: External normal; canals clear; tympanic membranes normal  Oropharynx: Pink, supple. No suspicious lesions  Neck: Supple without thyromegaly, adenopathy  Lungs: Respirations unlabored; clear to auscultation bilaterally without wheeze, rales, rhonchi  [facility] exam: normal rate and regular rhythm.  Extremities: No edema, cyanosis, clubbing  Vessels: Symmetric bilaterally  Neurologic: Alert and oriented; speech intact; face symmetrical; moves all extremities well; CNII-XII intact without focal deficit   Assessment:  1. Acute nonintractable headache, unspecified headache type  Plan:  Suspect migraine; will give Toradol IM 30 mg in office today; discussed concerns for severity of headache and inability to get any type of outpatient imaging done before next Monday; he understands that if headache does not resolve within 1-2 hours, he will go to ER for further evaluation today and imaging; If headache improves, plan to update CT for outpatient imaging; follow-up at that  time to be determined- will most likely plan to have him follow-up with his neurologist to discuss treatment due to severity of symptoms.  No follow-ups on file.  Orders Placed This Encounter  Procedures  . CT Head Wo Contrast    Standing Status:   Future    Standing Expiration Date:   12/09/2019    Scheduling Instructions:     Please try to schedule for next Wednesday, December 4 after 1:00 pm.    Order Specific Question:   Is patient pregnant?    Answer:   No    Order Specific Question:   Preferred imaging location?    Answer:   [place]    Order Specific Question:   Radiology Contrast Protocol - do NOT remove file path    Answer:   [email or web address]    Requested Prescriptions    No prescriptions requested or ordered in this encounter

## 2018-09-14 ENCOUNTER — Ambulatory Visit (INDEPENDENT_AMBULATORY_CARE_PROVIDER_SITE_OTHER)
Admission: RE | Admit: 2018-09-14 | Discharge: 2018-09-14 | Disposition: A | Discharge status: Home / Self Care | Payer: Federal, State, Local not specified - PPO | Source: Ambulatory Visit | Attending: Family | Admitting: Family

## 2018-09-14 DIAGNOSIS — R519 Headache, unspecified: Secondary | ICD-10-CM

## 2018-09-14 DIAGNOSIS — R51 Headache: Secondary | ICD-10-CM

## 2018-10-06 ENCOUNTER — Other Ambulatory Visit: Payer: Self-pay | Admitting: Adult Health

## 2018-10-06 ENCOUNTER — Telehealth: Payer: Self-pay | Admitting: Internal Medicine

## 2018-10-06 MED ORDER — MODAFINIL 200 MG PO TABS: 200.0000 mg | ORAL_TABLET | Freq: Every day | ORAL | 5 refills | Status: DC

## 2018-10-06 NOTE — Telephone Encounter (Signed)
Drug Registry checked:  Last fill 08-09-18 #30.  (prescription expired).

## 2018-10-06 NOTE — Telephone Encounter (Signed)
Please call for apt, needs apt for any refills

## 2018-10-06 NOTE — Telephone Encounter (Signed)
Can you please make patient an appointment. Thank you

## 2018-10-06 NOTE — Telephone Encounter (Signed)
Patient scheduled.

## 2018-10-18 ENCOUNTER — Encounter: Payer: Self-pay | Admitting: Internal Medicine

## 2018-10-18 ENCOUNTER — Ambulatory Visit (INDEPENDENT_AMBULATORY_CARE_PROVIDER_SITE_OTHER): Payer: Federal, State, Local not specified - PPO | Admitting: Internal Medicine

## 2018-10-18 ENCOUNTER — Other Ambulatory Visit (INDEPENDENT_AMBULATORY_CARE_PROVIDER_SITE_OTHER): Payer: Federal, State, Local not specified - PPO

## 2018-10-18 VITALS — BP 124/84 | HR 69 | Temp 98.5°F | Ht 64.0 in | Wt 183.0 lb

## 2018-10-18 DIAGNOSIS — Z0000 Encounter for general adult medical examination without abnormal findings: Secondary | ICD-10-CM

## 2018-10-18 DIAGNOSIS — Z23 Encounter for immunization: Secondary | ICD-10-CM

## 2018-10-18 DIAGNOSIS — E349 Endocrine disorder, unspecified: Secondary | ICD-10-CM | POA: Diagnosis not present

## 2018-10-18 LAB — COMPREHENSIVE METABOLIC PANEL
ALT: 29 U/L (ref 0–35)
AST: 22 U/L (ref 0–37)
Albumin: 4.6 g/dL (ref 3.5–5.2)
Alkaline Phosphatase: 66 U/L (ref 39–117)
BUN: 12 mg/dL (ref 6–23)
CO2: 30 mEq/L (ref 19–32)
Calcium: 10 mg/dL (ref 8.4–10.5)
Chloride: 103 mEq/L (ref 96–112)
Creatinine, Ser: 0.9 mg/dL (ref 0.40–1.20)
GFR: 82.14 mL/min (ref >60.00)
GLUCOSE: 98 mg/dL (ref 70–99)
Potassium: 3.9 mEq/L (ref 3.5–5.1)
Sodium: 139 mEq/L (ref 135–145)
Total Bilirubin: 0.3 mg/dL (ref 0.2–1.2)
Total Protein: 7 g/dL (ref 6.0–8.3)

## 2018-10-18 LAB — CBC
HCT: 49.4 % — ABNORMAL HIGH (ref 36.0–46.0)
Hemoglobin: 16.8 g/dL — ABNORMAL HIGH (ref 12.0–15.0)
MCHC: 34 g/dL (ref 30.0–36.0)
MCV: 88.3 fl (ref 78.0–100.0)
Platelets: 280 10*3/uL (ref 150.0–400.0)
RBC: 5.6 Mil/uL — AB (ref 3.87–5.11)
RDW: 13.5 % (ref 11.5–15.5)
WBC: 9.2 10*3/uL (ref 4.0–10.5)

## 2018-10-18 NOTE — Progress Notes (Signed)
   Subjective:   Patient ID: Shannon Clay, adult    DOB: 1995-12-01, 23 y.o.   MRN: [id number]  HPI The patient is a 23 YO coming in for physical.   PMH, [facility], social history reviewed and updated.   Review of Systems  Constitutional: Negative.   HENT: Negative.   Eyes: Negative.   Respiratory: Negative for cough, chest tightness and shortness of breath.   Cardiovascular: Negative for chest pain, palpitations and leg swelling.  Gastrointestinal: Negative for abdominal distention, abdominal pain, constipation, diarrhea, nausea and vomiting.  Musculoskeletal: Negative.   Skin: Negative.   Neurological: Negative.   Psychiatric/Behavioral: Negative.     Objective:  Physical Exam Constitutional:      Appearance: He is well-developed.  HENT:     Head: Normocephalic and atraumatic.  Neck:     Musculoskeletal: Normal range of motion.  Cardiovascular:     Rate and Rhythm: Normal rate and regular rhythm.  Pulmonary:     Effort: Pulmonary effort is normal. No respiratory distress.     Breath sounds: Normal breath sounds. No wheezing or rales.  Abdominal:     General: Bowel sounds are normal. There is no distension.     Palpations: Abdomen is soft.     Tenderness: There is no abdominal tenderness. There is no rebound.  Skin:    General: Skin is warm and dry.  Neurological:     Mental Status: He is alert and oriented to person, place, and time.     Coordination: Coordination normal.     Vitals:   10/18/18 1417  BP: 124/84  Pulse: 69  Temp: 98.5 F (36.9 C)  TempSrc: Oral  SpO2: 96%  Weight: 183 lb (83 kg)  Height: 5\' 4"  (1.626 m)    Assessment & Plan:  Flu shot given at visit

## 2018-10-19 LAB — TESTOSTERONE, FREE, TOTAL, SHBG
Sex Hormone Binding: 40.6 nmol/L (ref 24.6–122.0)
Testosterone, Free: 16.5 pg/mL — ABNORMAL HIGH (ref 0.0–4.2)
Testosterone: 555 ng/dL — ABNORMAL HIGH (ref 8–48)

## 2018-10-21 DIAGNOSIS — Z0000 Encounter for general adult medical examination without abnormal findings: Secondary | ICD-10-CM | POA: Insufficient documentation

## 2018-10-21 NOTE — Assessment & Plan Note (Signed)
Flu shot given. Tetanus up to date. Pap smear counseled that this is appropriate. Counseled about sun safety and mole surveillance. Counseled about the dangers of distracted driving. Given 10 year screening recommendations.

## 2018-10-21 NOTE — Assessment & Plan Note (Signed)
Needs labs for monitoring. Satisfied at this time.

## 2018-10-24 ENCOUNTER — Encounter: Payer: Self-pay | Admitting: Adult Health

## 2018-10-24 ENCOUNTER — Ambulatory Visit: Payer: Federal, State, Local not specified - PPO | Admitting: Adult Health

## 2018-10-24 ENCOUNTER — Other Ambulatory Visit: Payer: Self-pay

## 2018-10-24 VITALS — BP 102/65 | HR 70 | Resp 20 | Ht 64.0 in | Wt 184.4 lb

## 2018-10-24 DIAGNOSIS — G43109 Migraine with aura, not intractable, without status migrainosus: Secondary | ICD-10-CM

## 2018-10-24 DIAGNOSIS — G47411 Narcolepsy with cataplexy: Secondary | ICD-10-CM | POA: Diagnosis not present

## 2018-10-24 MED ORDER — DICLOFENAC POTASSIUM(MIGRAINE) 50 MG PO PACK: PACK | ORAL | 5 refills | Status: DC

## 2018-10-24 NOTE — Progress Notes (Signed)
PATIENT: Shannon Clay DOB: 1996/08/01  REASON FOR VISIT: follow up HISTORY FROM: patient  HISTORY OF PRESENT ILLNESS: Today 10/24/18: Mr. Name is a 23 year old female with a history of narcolepsy.  She returns today for follow-up.  She reports that Provigil works well for her.  She does report that at times she gets sleepy but she is able to manage.  She states that she is also been having migraine headaches.  This started when she was in her teens.  Her primary care ordered a CT of the head that was unremarkable.  She states that she has approximately one severe migraine a month.  But she may have a headache every 2 weeks.  She states when she does get a headache it occurs all over the head.  She does have photophobia and phonophobia as well as nausea and vomiting.  She states that typically her headaches start with a flashing light in her vision.  She states that with her headaches she can sometimes have a loss of vision and numbness on one side of the body.  She states when this happens she is unable to work.  She reports that in her teens she was on medication (not daily) but is unsure what the medication was.  She returns today for follow-up.  HISTORY 03/30/18 Mr. 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  She returns today for follow-up.  She reports that Xyrem was too expensive.  She reports that she has been on Provigil but was unable to get a prescription this last month so she has been off the medication for 1 month.  She reports that Provigil was beneficial but did not completely resolve her daytime sleepiness.  She denies falling asleep while at work or driving.  She states that Provigil is definitely better than not taking anything at all.  She returns today for an evaluation.   REVIEW OF SYSTEMS: Out of a complete 14 system review of symptoms, the patient complains only of the following symptoms, and all other reviewed systems are negative.  See  HPI  ALLERGIES: Allergies  Allergen Reactions  . Latex Swelling    Swelling   . Sulfa Antibiotics     HOME MEDICATIONS: Outpatient Medications Prior to Visit  Medication Sig Dispense Refill  . citalopram (CELEXA) 20 MG tablet Take 1 tablet (20 mg total) by mouth daily. 90 tablet 3  . modafinil (PROVIGIL) 200 MG tablet Take 1 tablet (200 mg total) by mouth daily. 30 tablet 5  . testosterone cypionate (DEPOTESTOSTERONE CYPIONATE) 200 MG/ML injection INJECT 0.5 MLS (100 MG TOTAL) INTO THE MUSCLE EVERY 14 (FOURTEEN) DAYS. 1 mL 0   No facility-administered medications prior to visit.     PAST MEDICAL HISTORY: Past Medical History:  Diagnosis Date  . ADHD (attention deficit hyperactivity disorder)   . Asthma   . Depression   . Frequent headaches   . Migraine     PAST SURGICAL HISTORY: No past surgical history on file.  FAMILY HISTORY: Family History  Problem Relation Age of Onset  . ADD / ADHD Mother   . Anxiety disorder Mother   . Mental illness Mother   . OCD Father   . Anxiety disorder Sister   . Depression Sister   . Depression Paternal Aunt   . Arthritis Maternal Grandmother     SOCIAL HISTORY: Social History   Socioeconomic History  . Marital status: Single    Spouse name: Not on file  .  Number of children: Not on file  . Years of education: Not on file  . Highest education level: Not on file  Occupational History  . Not on file  Social Needs  . Financial resource strain: Not on file  . Food insecurity:    Worry: Not on file    Inability: Not on file  . Transportation needs:    Medical: Not on file    Non-medical: Not on file  Tobacco Use  . Smoking status: Never Smoker  . Smokeless tobacco: Never Used  Substance and Sexual Activity  . Alcohol use: No    Comment: social  . Drug use: No  . Sexual activity: Never  Lifestyle  . Physical activity:    Days per week: Not on file    Minutes per session: Not on file  . Stress: Not on file   Relationships  . Social connections:    Talks on phone: Not on file    Gets together: Not on file    Attends religious service: Not on file    Active member of club or organization: Not on file    Attends meetings of clubs or organizations: Not on file    Relationship status: Not on file  . Intimate partner violence:    Fear of current or ex partner: Not on file    Emotionally abused: Not on file    Physically abused: Not on file    Forced sexual activity: Not on file  Other Topics Concern  . Not on file  Social History Narrative  . Not on file      PHYSICAL EXAM  Vitals:   10/24/18 1354  BP: 102/65  Pulse: 70  Resp: 20  Weight: 184 lb 6.4 oz (83.6 kg)  Height: 5\' 4"  (1.626 m)   Body mass index is 31.65 kg/m.  Generalized: Well developed, in no acute distress   Neurological examination  Mentation: Alert oriented to time, place, history taking. Follows all commands speech and language fluent Cranial nerve II-XII: Pupils were equal round reactive to light. Extraocular movements were full, visual field were full on confrontational test. Facial sensation and strength were normal. Uvula tongue midline. Head turning and shoulder shrug  were normal and symmetric. Motor: The motor testing reveals 5 over 5 strength of all 4 extremities. Good symmetric motor tone is noted throughout.  Sensory: Sensory testing is intact to soft touch on all 4 extremities. No evidence of extinction is noted.  Coordination: Cerebellar testing reveals good finger-nose-finger and heel-to-shin bilaterally.  Gait and station: Gait is normal. Tandem gait is normal. Romberg is negative. No drift is seen.  Reflexes: Deep tendon reflexes are symmetric and normal bilaterally.   DIAGNOSTIC DATA (LABS, IMAGING, TESTING) - I reviewed patient records, labs, notes, testing and imaging myself where available.  Lab Results  Component Value Date   WBC 9.2 10/18/2018   HGB 16.8 (H) 10/18/2018   HCT 49.4 (H)  10/18/2018   MCV 88.3 10/18/2018   PLT 280.0 10/18/2018      Component Value Date/Time   NA 139 10/18/2018 1447   NA 143 11/25/2017 0921   K 3.9 10/18/2018 1447   CL 103 10/18/2018 1447   CO2 30 10/18/2018 1447   GLUCOSE 98 10/18/2018 1447   BUN 12 10/18/2018 1447   BUN 13 11/25/2017 0921   CREATININE 0.90 10/18/2018 1447   CALCIUM 10.0 10/18/2018 1447   PROT 7.0 10/18/2018 1447   PROT 6.3 11/25/2017 0921   ALBUMIN 4.6  10/18/2018 1447   ALBUMIN 4.5 11/25/2017 0921   AST 22 10/18/2018 1447   ALT 29 10/18/2018 1447   ALKPHOS 66 10/18/2018 1447   BILITOT 0.3 10/18/2018 1447   BILITOT 0.5 11/25/2017 0921   GFRNONAA 108 11/25/2017 0921   GFRAA 125 11/25/2017 0921   Lab Results  Component Value Date   CHOL 161 02/18/2017   HDL 49.60 02/18/2017   LDLCALC 97 02/18/2017   TRIG 75.0 02/18/2017   CHOLHDL 3 02/18/2017   No results found for: HGBA1C No results found for: VITAMINB12 Lab Results  Component Value Date   TSH 2.21 02/18/2017      ASSESSMENT AND PLAN 23 y.o. year old adult  has a past medical history of ADHD (attention deficit hyperactivity disorder), Asthma, Depression, Frequent headaches, and Migraine. here with:  1.  Narcolepsy 2.  Migraine headaches  The patient will continue on Provigil.  She was given a prescription of Cambia to use for her migraine headaches.  Advised her that she should take this medication at the onset of a migraine.  I reviewed potential side effects with the patient.  I provided her with a handout.  She is advised that if her headache frequency increases she should let us know.  At that time we have to consider a daily medication.  She voiced understanding.  She will follow-up in 6 months or sooner if needed.    Ward Givens, MSN, NP-C 10/24/2018, 1:50 PM [facility] [address], [place], [place] [address] [phone]

## 2018-10-24 NOTE — Patient Instructions (Addendum)
Your Plan:  Continue Provigil  Take Cambia 50 mg at the onset of migraine If your symptoms worsen or you develop new symptoms please let us know.   Thank you for coming to see [place] at [facility]. I hope we have been able to provide you high quality care today.  You may receive a patient satisfaction survey over the next few weeks. We would appreciate your feedback and comments so that we may continue to improve ourselves and the health of our patients.  Diclofenac powder for oral solution What is this medicine? DICLOFENAC (dye KLOE fen ak) is a non-steroidal anti-inflammatory drug (NSAID). It is used to treat migraine pain. This medicine may be used for other purposes; ask your health care provider or pharmacist if you have questions. COMMON BRAND NAME(S): Cambia What should I tell my health care provider before I take this medicine? They need to know if you have any of these conditions: -asthma, especially aspirin sensitive asthma -coronary artery bypass graft (CABG) surgery within the past 2 weeks -drink more than 3 alcohol-containing drinks a day -heart disease or circulation problems like heart failure or leg edema (fluid retention) -high blood pressure -kidney disease -liver disease -phenylketonuria -stomach problems -an unusual or allergic reaction to diclofenac, aspirin, other NSAIDs, other medicines, foods, dyes, or preservatives -pregnant or trying to get pregnant -breast-feeding How should I use this medicine? Mix this medicine with 1 to 2 ounces of water. Drink the medicine and water together. Follow the directions on the prescription label. Do not take your medicine more often than directed. Long-term, continuous use may increase the risk of heart attack or stroke. A special MedGuide will be given to you by the pharmacist with each prescription and refill. Be sure to read this information carefully each time. Talk to your pediatrician regarding the use of  this medicine in children. Special care may be needed. Elderly patients over 29 years old may have a stronger reaction and need a smaller dose. Overdosage: If you think you have taken too much of this medicine contact a poison control center or emergency room at once. NOTE: This medicine is only for you. Do not share this medicine with others. What if I miss a dose? This does not apply. What may interact with this medicine? Do not take this medicine with any of the following medications: -cidofovir -ketorolac -methotrexate This medicine may also interact with the following medications: -alcohol -aspirin and aspirin-like medicines -cyclosporine -diuretics -lithium -medicines for blood pressure -medicines for osteoporosis -medicines that affect platelets -medicines that treat or prevent blood clots like warfarin -NSAIDs, medicines for pain and inflammation, like ibuprofen or naproxen -pemetrexed -steroid medicines like prednisone or cortisone This list may not describe all possible interactions. Give your health care provider a list of all the medicines, herbs, non-prescription drugs, or dietary supplements you use. Also tell them if you smoke, drink alcohol, or use illegal drugs. Some items may interact with your medicine. What should I watch for while using this medicine? Tell your doctor or health care professional if your pain does not get better. Talk to your doctor before taking another medicine for pain. Do not treat yourself. This medicine does not prevent heart attack or stroke. In fact, this medicine may increase the chance of a heart attack or stroke. The chance may increase with longer use of this medicine and in people who have heart disease. If you take aspirin to prevent heart attack or stroke, talk with your doctor or  health care professional. Do not take medicines such as ibuprofen and naproxen with this medicine. Side effects such as stomach upset, nausea, or ulcers may be  more likely to occur. Many medicines available without a prescription should not be taken with this medicine. This medicine can cause ulcers and bleeding in the stomach and intestines at any time during treatment. Do not smoke cigarettes or drink alcohol. These increase irritation to your stomach and can make it more susceptible to damage from this medicine. Ulcers and bleeding can happen without warning symptoms and can cause death. You may get drowsy or dizzy. Do not drive, use machinery, or do anything that needs mental alertness until you know how this medicine affects you. Do not stand or sit up quickly, especially if you are an older patient. This reduces the risk of dizzy or fainting spells. This medicine can cause you to bleed more easily. Try to avoid damage to your teeth and gums when you brush or floss your teeth. If you take migraine medicines for 10 or more days a month, your migraines may get worse. Keep a diary of headache days and medicine use. Contact your healthcare professional if your migraine attacks occur more frequently. What side effects may I notice from receiving this medicine? Side effects that you should report to your doctor or health care professional as soon as possible: -allergic reactions like skin rash, itching or hives, swelling of the face, lips, or tongue -black or bloody stools, blood in the urine or vomit -blurred vision -chest pain -difficulty breathing or wheezing -nausea or vomiting -fever -redness, blistering, peeling or loosening of the skin, including inside the mouth -slurred speech or weakness on one side of the body -trouble passing urine or change in the amount of urine -unexplained weight gain or swelling -unusually weak or tired -yellowing of eyes or skin Side effects that usually do not require medical attention (report to your doctor or health care professional if they continue or are  bothersome): -constipation -diarrhea -dizziness -headache -heartburn This list may not describe all possible side effects. Call your doctor for medical advice about side effects. You may report side effects to FDA at 1-800-FDA-1088. Where should I keep my medicine? Keep out of the reach of children. Store at room temperature between 15 and 30 degrees C (59 and 86 degrees F). Throw away any unused medicine after the expiration date. NOTE: This sheet is a summary. It may not cover all possible information. If you have questions about this medicine, talk to your doctor, pharmacist, or health care provider.  2019 Elsevier/Gold Standard (2015-10-31 09:56:49)

## 2018-10-26 LAB — HIV ANTIBODY (ROUTINE TESTING W REFLEX): HIV 1&2 Ab, 4th Generation: NONREACTIVE

## 2018-10-26 LAB — ESTROGENS, TOTAL: ESTROGEN: 211.1 pg/mL

## 2018-12-14 ENCOUNTER — Other Ambulatory Visit: Payer: Self-pay | Admitting: Internal Medicine

## 2018-12-21 ENCOUNTER — Telehealth: Payer: Self-pay | Admitting: *Deleted

## 2018-12-21 NOTE — Telephone Encounter (Signed)
PA for Modafinil 200mg  tablets #90/90 completed via CoverMyMeds. Key# [id number]. 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

## 2018-12-21 NOTE — Telephone Encounter (Signed)
Per CoverMyMeds, Modafinil PA approved by [facility] [employer], phone# [phone]. PA# [id number]. Approval fax to follow/fim

## 2019-02-27 ENCOUNTER — Encounter: Payer: Self-pay | Admitting: Internal Medicine

## 2019-02-27 ENCOUNTER — Ambulatory Visit: Payer: Self-pay | Admitting: Internal Medicine

## 2019-02-27 ENCOUNTER — Ambulatory Visit (INDEPENDENT_AMBULATORY_CARE_PROVIDER_SITE_OTHER): Payer: Federal, State, Local not specified - PPO | Admitting: Internal Medicine

## 2019-02-27 ENCOUNTER — Ambulatory Visit (INDEPENDENT_AMBULATORY_CARE_PROVIDER_SITE_OTHER)
Admission: RE | Admit: 2019-02-27 | Discharge: 2019-02-27 | Disposition: A | Discharge status: Home / Self Care | Payer: Federal, State, Local not specified - PPO | Source: Ambulatory Visit | Attending: Internal Medicine | Admitting: Internal Medicine

## 2019-02-27 ENCOUNTER — Other Ambulatory Visit: Payer: Self-pay

## 2019-02-27 VITALS — BP 110/80 | HR 83 | Temp 98.6°F | Ht 64.0 in | Wt 184.0 lb

## 2019-02-27 DIAGNOSIS — S99922A Unspecified injury of left foot, initial encounter: Secondary | ICD-10-CM | POA: Diagnosis not present

## 2019-02-27 DIAGNOSIS — M79672 Pain in left foot: Secondary | ICD-10-CM | POA: Diagnosis not present

## 2019-02-27 NOTE — Telephone Encounter (Signed)
  Pt called in c/o possibly broke his toe Saturday after kicking a piece of furniture. He can walk but it hurts.   His job entails he be on his feet.  I spoke with [place] and she is going to check with Dr. Sharlet Salina and see if she wants him to come in or do a virtual visit (COVID-19 pandemic).   They will call him back.    He was agreeable to this.  I sent these note to the office.  Reason for Disposition . [1] Toe injury AND [2] bad limp or can't wear shoes/sandals  Answer Assessment - Initial Assessment Questions 1. MECHANISM: "How did the injury happen?"      I kicked corner of a wooden amware.   My toe was bloody, I cleaned it.   It's swollen and sore by the toenail.   It's the toe next to my little toe on the left foot. 2. ONSET: "When did the injury happen?" (Minutes or hours ago)      Sat. Morning. 3. LOCATION: "What part of the toe is injured?" "Is the nail damaged?"      Swollen at tip of toe.    The nail at the corner has a blister. 4. APPEARANCE of TOE INJURY: "What does the injury look like?"      See above. 5. SEVERITY: "Can you use the foot normally?" "Can you walk?"      It hurts but I can walk.   My job entails me being on my feet. 6. SIZE: For cuts, bruises, or swelling, ask: "How large is it?" (e.g., inches or centimeters;  entire toe)      Next to the nail and the tip of my toe looks like a blood blister. 7. PAIN: "Is there pain?" If so, ask: "How bad is the pain?"   (e.g., Scale 1-10; or mild, moderate, severe)     Sometimes it hurts when I'm not walking on it.   8. TETANUS: For any breaks in the skin, ask: "When was the last tetanus booster?"     2 yrs ago 9. DIABETES: "Do you have a history of diabetes or poor circulation in the feet?"     No 10. OTHER SYMPTOMS: "Do you have any other symptoms?"        No  It looks better this morning. 11. PREGNANCY: "Is there any chance you are pregnant?" "When was your last menstrual period?"       N/A  Protocols used: TOE  INJURY-A-AH

## 2019-02-27 NOTE — Progress Notes (Signed)
   Subjective:   Patient ID: Shannon Clay, adult    DOB: 05/31/1996, 23 y.o.   MRN: [id number]  HPI The patient is 23, hit toe on left foot against wooden object by accident. Hurt quite a lot after the injury. That happened Saturday and has been improving some after that. On feet for job and this is hurting and throbbing. Has not taken pain meds. Walking with minimal pain. 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 Some mild swelling in the toe but this is improving also. Minimal bruising on the toe as well. Pain 4/10 at worst.  Review of Systems  Constitutional: Negative.   HENT: Negative.   Eyes: Negative.   Respiratory: Negative for cough, chest tightness and shortness of breath.   Cardiovascular: Negative for chest pain, palpitations and leg swelling.  Gastrointestinal: Negative for abdominal distention, abdominal pain, constipation, diarrhea, nausea and vomiting.  Musculoskeletal: Positive for arthralgias, joint swelling and myalgias.  Skin: Negative.   Neurological: Negative.   Psychiatric/Behavioral: Negative.     Objective:  Physical Exam Constitutional:      Appearance: He is well-developed.  HENT:     Head: Normocephalic and atraumatic.  Neck:     Musculoskeletal: Normal range of motion.  Cardiovascular:     Rate and Rhythm: Normal rate and regular rhythm.  Pulmonary:     Effort: Pulmonary effort is normal. No respiratory distress.     Breath sounds: Normal breath sounds. No wheezing or rales.  Abdominal:     General: Bowel sounds are normal. There is no distension.     Palpations: Abdomen is soft.     Tenderness: There is no abdominal tenderness. There is no rebound.  Musculoskeletal:     Comments: 4th toe left foot with bruising, minimal swelling and tenderness distal 2 joints, no pain over the mtp region or ankle.   Skin:    General: Skin is warm and dry.  Neurological:     Mental Status: He is alert and oriented to person,  place, and time.     Coordination: Coordination normal.     Vitals:   02/27/19 1534  BP: 110/80  Pulse: 83  Temp: 98.6 F (37 C)  TempSrc: Oral  SpO2: 98%  Weight: 184 lb (83.5 kg)  Height: 5\' 4"  (1.626 m)    Assessment & Plan:

## 2019-02-27 NOTE — Patient Instructions (Signed)
We will do the x-ray today 

## 2019-02-27 NOTE — Telephone Encounter (Signed)
Can do in person or virtual for evaluation

## 2019-02-27 NOTE — Telephone Encounter (Signed)
Appointment has been made for today 5/18 with Dr.Crawford.

## 2019-02-27 NOTE — Telephone Encounter (Signed)
NO SX.

## 2019-02-27 NOTE — Telephone Encounter (Signed)
Did you ask about symptoms for covid screening?

## 2019-02-27 NOTE — Telephone Encounter (Signed)
Please advise if they can come in office or need to be virtual visit?

## 2019-02-28 DIAGNOSIS — M79672 Pain in left foot: Secondary | ICD-10-CM | POA: Insufficient documentation

## 2019-02-28 NOTE — Assessment & Plan Note (Signed)
RICE, ibuprofen for pain. Work note to stay out for several days to allow improvement. X-ray to rule out fracture for prognosis.

## 2019-03-08 ENCOUNTER — Other Ambulatory Visit: Payer: Self-pay | Admitting: Internal Medicine

## 2019-03-08 NOTE — Telephone Encounter (Signed)
[place] Controlled Database Checked Last filled: 12/15/18 # 12mL LOV w/you: 10/18/18 CPE Next appt w/you: 10/20/19

## 2019-03-15 DIAGNOSIS — F341 Dysthymic disorder: Secondary | ICD-10-CM | POA: Diagnosis not present

## 2019-03-15 DIAGNOSIS — G47419 Narcolepsy without cataplexy: Secondary | ICD-10-CM | POA: Diagnosis not present

## 2019-04-05 ENCOUNTER — Other Ambulatory Visit: Payer: Self-pay | Admitting: Neurology

## 2019-05-04 ENCOUNTER — Encounter: Payer: Self-pay | Admitting: Internal Medicine

## 2019-05-04 ENCOUNTER — Other Ambulatory Visit (INDEPENDENT_AMBULATORY_CARE_PROVIDER_SITE_OTHER): Payer: Federal, State, Local not specified - PPO

## 2019-05-04 ENCOUNTER — Other Ambulatory Visit: Payer: Self-pay

## 2019-05-04 ENCOUNTER — Ambulatory Visit: Payer: Self-pay

## 2019-05-04 ENCOUNTER — Ambulatory Visit (INDEPENDENT_AMBULATORY_CARE_PROVIDER_SITE_OTHER): Payer: Federal, State, Local not specified - PPO | Admitting: Internal Medicine

## 2019-05-04 DIAGNOSIS — F411 Generalized anxiety disorder: Secondary | ICD-10-CM

## 2019-05-04 DIAGNOSIS — R1013 Epigastric pain: Secondary | ICD-10-CM | POA: Diagnosis not present

## 2019-05-04 DIAGNOSIS — J4520 Mild intermittent asthma, uncomplicated: Secondary | ICD-10-CM | POA: Diagnosis not present

## 2019-05-04 LAB — CBC WITH DIFFERENTIAL/PLATELET
Basophils Absolute: 0.1 10*3/uL (ref 0.0–0.1)
Basophils Relative: 0.8 % (ref 0.0–3.0)
Eosinophils Absolute: 0.2 10*3/uL (ref 0.0–0.7)
Eosinophils Relative: 1.9 % (ref 0.0–5.0)
HCT: 47.1 % — ABNORMAL HIGH (ref 36.0–46.0)
Hemoglobin: 15.6 g/dL — ABNORMAL HIGH (ref 12.0–15.0)
Lymphocytes Relative: 26.2 % (ref 12.0–46.0)
Lymphs Abs: 2.1 10*3/uL (ref 0.7–4.0)
MCHC: 33.1 g/dL (ref 30.0–36.0)
MCV: 89.7 fl (ref 78.0–100.0)
Monocytes Absolute: 0.8 10*3/uL (ref 0.1–1.0)
Monocytes Relative: 9.3 % (ref 3.0–12.0)
Neutro Abs: 5 10*3/uL (ref 1.4–7.7)
Neutrophils Relative %: 61.8 % (ref 43.0–77.0)
Platelets: 293 10*3/uL (ref 150.0–400.0)
RBC: 5.25 Mil/uL — ABNORMAL HIGH (ref 3.87–5.11)
RDW: 13.8 % (ref 11.5–15.5)
WBC: 8.1 10*3/uL (ref 4.0–10.5)

## 2019-05-04 LAB — HEPATIC FUNCTION PANEL
ALT: 34 U/L (ref 0–35)
AST: 20 U/L (ref 0–37)
Albumin: 4.5 g/dL (ref 3.5–5.2)
Alkaline Phosphatase: 60 U/L (ref 39–117)
Bilirubin, Direct: 0.1 mg/dL (ref 0.0–0.3)
Total Bilirubin: 0.4 mg/dL (ref 0.2–1.2)
Total Protein: 6.8 g/dL (ref 6.0–8.3)

## 2019-05-04 LAB — LIPASE: Lipase: 25 U/L (ref 11.0–59.0)

## 2019-05-04 LAB — BASIC METABOLIC PANEL
BUN: 13 mg/dL (ref 6–23)
CO2: 28 mEq/L (ref 19–32)
Calcium: 9.5 mg/dL (ref 8.4–10.5)
Chloride: 105 mEq/L (ref 96–112)
Creatinine, Ser: 0.81 mg/dL (ref 0.40–1.20)
GFR: 86.87 mL/min (ref >60.00)
Glucose, Bld: 100 mg/dL — ABNORMAL HIGH (ref 70–99)
Potassium: 4.3 mEq/L (ref 3.5–5.1)
Sodium: 139 mEq/L (ref 135–145)

## 2019-05-04 MED ORDER — PANTOPRAZOLE SODIUM 40 MG PO TBEC: 40.0000 mg | DELAYED_RELEASE_TABLET | Freq: Every day | ORAL | 3 refills | Status: DC

## 2019-05-04 MED ORDER — ONDANSETRON HCL 4 MG PO TABS: 4.0000 mg | ORAL_TABLET | Freq: Three times a day (TID) | ORAL | 0 refills | Status: DC | PRN

## 2019-05-04 NOTE — Patient Instructions (Addendum)
Please take all new medication as prescribed - the protonix for acid, and zofran for nausea  Please continue all other medications as before, and refills have been done if requested.  Please have the pharmacy call with any other refills you may need.  Please continue your efforts at being more active, low cholesterol diet, and weight control.  Please keep your appointments with your specialists as you may have planned  Please go to the LAB in the Basement (turn left off the elevator) for the tests to be done today  You will be contacted by phone if any changes need to be made immediately.  Otherwise, you will receive a letter about your results with an explanation, but please check with MyChart first.  Please remember to sign up for MyChart if you have not done so, as this will be important to you in the future with finding out test results, communicating by private email, and scheduling acute appointments online when needed.  You are given the work note

## 2019-05-04 NOTE — Assessment & Plan Note (Signed)
Exam c/w gastritis +/- worsening reflux, advised reflux diet, wt loss, protonix 40 qd, zofran prn and labs as ordered; ok for work note

## 2019-05-04 NOTE — Assessment & Plan Note (Signed)
stable overall by history and exam, recent data reviewed with pt, and pt to continue medical treatment as before,  to f/u any worsening symptoms or concerns  

## 2019-05-04 NOTE — Telephone Encounter (Signed)
Incoming call from Patient with complaint of abdominal pain.  Denies it radiating. Gradual onset, that comes and goes. Rates the pain as moderate. Denies vomiting. But very nauseated. Reviewed protocol and provided care advice.  Transferred to office to schedule appointment.           Reason for Disposition . [1] MODERATE pain (e.g., interferes with normal activities) AND [2] pain comes and goes (cramps) AND [3] present > 24 hours  (Exception: pain with Vomiting or Diarrhea - see that Guideline)  Answer Assessment - Initial Assessment Questions 1. LOCATION: "Where does it hurt?"    Above belly button 2. RADIATION: "Does the pain shoot anywhere else?" (e.g., chest, back)     Denies  3. ONSET: "When did the pain begin?" (e.g., minutes, hours or days ago)      Last Friday 4. SUDDEN: "Gradual or sudden onset?"    Gradual  5. PATTERN "Does the pain come and go, or is it constant?"    - If constant: "Is it getting better, staying the same, or worsening?"      (Note: Constant means the pain never goes away completely; most serious pain is constant and it progresses)     - If intermittent: "How long does it last?" "Do you have pain now?"     (Note: Intermittent means the pain goes away completely between bouts)     Comes and goes 6. SEVERITY: "How bad is the pain?"  (e.g., Scale 1-10; mild, moderate, or severe)   - MILD (1-3): doesn't interfere with normal activities, abdomen soft and not tender to touch    - MODERATE (4-7): interferes with normal activities or awakens from sleep, tender to touch    - SEVERE (8-10): excruciating pain, doubled over, unable to do any normal activities      severe 7. RECURRENT SYMPTOM: "Have you ever had this type of abdominal pain before?" If so, ask: "When was the last time?" and "What happened that time?"      denies 8. CAUSE: "What do you think is causing the abdominal pain?"    Not sure9. RELIEVING/AGGRAVATING FACTORS: "What makes it better or worse?" (e.g.,  movement, antacids, bowel movement)     pepto bismal 10. OTHER SYMPTOMS: "Has there been any vomiting, diarrhea, constipation, or urine problems?"       Denies,  nauseated 11. PREGNANCY: "Is there any chance you are pregnant?" "When was your last menstrual period?"  Protocols used: ABDOMINAL PAIN - [facility]

## 2019-05-04 NOTE — Telephone Encounter (Signed)
Noted  

## 2019-05-04 NOTE — Progress Notes (Signed)
Subjective:    Patient ID: Shannon Clay, adult    DOB: 07-Aug-1996, 23 y.o.   MRN: [id number]  HPI  Here with 6 days onset epigastric abd pain with radiation towards the left costal margin at times, mild to mod, intermittent , assoc with nausea but no vomiting, has some looser stool than usual but otherwise no diarrhea or constipation, pepto bismol and gaviscon help somewhat; denies fever, no prior hx of similar symptoms, has not otherwise not tried antacid.  Has had increased recent stressors and thinks maybe related.  Asks for work note for tomorrow.  No wt loss, lower appetite and Denies worsening dysphagia, or blood.  Pt denies chest pain, increased sob or doe, wheezing, orthopnea, PND, increased LE swelling, palpitations, dizziness or syncope.   Pt denies polydipsia, polyuria. Past Medical History:  Diagnosis Date  . ADHD (attention deficit hyperactivity disorder)   . Asthma   . Depression   . Frequent headaches   . Migraine    No past surgical history on file.  reports that he has never smoked. He has never used smokeless tobacco. He reports that he does not drink alcohol or use drugs. family history includes ADD / ADHD in his mother; Anxiety disorder in his mother and sister; Arthritis in his maternal grandmother; Depression in his paternal aunt and sister; Mental illness in his mother; OCD in his father. Allergies  Allergen Reactions  . Latex Swelling    Swelling   . Sulfa Antibiotics    Current Outpatient Medications on File Prior to Visit  Medication Sig Dispense Refill  . citalopram (CELEXA) 20 MG tablet Take 1 tablet (20 mg total) by mouth daily. 90 tablet 3  . Diclofenac Potassium (CAMBIA) 50 MG PACK Take 50 mg at the onset of migraine. 1 each 5  . modafinil (PROVIGIL) 200 MG tablet TAKE 1 TABLET EVERY DAY 30 tablet 5  . testosterone cypionate (DEPOTESTOSTERONE CYPIONATE) 200 MG/ML injection INJECT 0.5 MLS INTO THE MUSCLE EVERY 14 (FOURTEEN) DAYS. 6 mL 1   No current  facility-administered medications on file prior to visit.    Review of Systems  Constitutional: Negative for other unusual diaphoresis or sweats HENT: Negative for ear discharge or swelling Eyes: Negative for other worsening visual disturbances Respiratory: Negative for stridor or other swelling  Gastrointestinal: Negative for worsening distension or other blood Genitourinary: Negative for retention or other urinary change Musculoskeletal: Negative for other MSK pain or swelling Skin: Negative for color change or other new lesions Neurological: Negative for worsening tremors and other numbness  Psychiatric/Behavioral: Negative for worsening agitation or other fatigue All other system neg per pt    Objective:   Physical Exam BP 114/78   Pulse 79   Temp 98.1 F (36.7 C) (Oral)   Ht 5\' 3"  (1.6 m)   Wt 189 lb (85.7 kg)   SpO2 98%   BMI 33.48 kg/m  VS noted,  Constitutional: Pt appears in NAD HENT: Head: NCAT.  Right Ear: External ear normal.  Left Ear: External ear normal.  Eyes: . Pupils are equal, round, and reactive to light. Conjunctivae and EOM are normal Nose: without d/c or deformity Neck: Neck supple. Gross normal ROM Cardiovascular: Normal rate and regular rhythm.   Pulmonary/Chest: Effort normal and breath sounds without rales or wheezing.  Abd:  Soft, mild epigastric tender, ND, + BS, no organomegaly Neurological: Pt is alert. At baseline orientation, motor grossly intact Skin: Skin is warm. No rashes, other new lesions, no LE edema  Psychiatric: Pt behavior is normal without agitation , 2+ nervous No other exam findings Lab Results  Component Value Date   WBC 8.1 05/04/2019   HGB 15.6 (H) 05/04/2019   HCT 47.1 (H) 05/04/2019   PLT 293.0 05/04/2019   GLUCOSE 100 (H) 05/04/2019   CHOL 161 02/18/2017   TRIG 75.0 02/18/2017   HDL 49.60 02/18/2017   LDLCALC 97 02/18/2017   ALT 34 05/04/2019   AST 20 05/04/2019   NA 139 05/04/2019   K 4.3 05/04/2019   CL 105  05/04/2019   CREATININE 0.81 05/04/2019   BUN 13 05/04/2019   CO2 28 05/04/2019   TSH 2.21 02/18/2017       Assessment & Plan:

## 2019-05-17 ENCOUNTER — Ambulatory Visit: Payer: Federal, State, Local not specified - PPO | Admitting: Adult Health

## 2019-05-17 ENCOUNTER — Telehealth: Payer: Self-pay

## 2019-05-17 NOTE — Telephone Encounter (Signed)
Patient was a no call/no show for their appointment today.   

## 2019-05-24 DIAGNOSIS — G47419 Narcolepsy without cataplexy: Secondary | ICD-10-CM | POA: Diagnosis not present

## 2019-05-24 DIAGNOSIS — F341 Dysthymic disorder: Secondary | ICD-10-CM | POA: Diagnosis not present

## 2019-06-13 ENCOUNTER — Ambulatory Visit (INDEPENDENT_AMBULATORY_CARE_PROVIDER_SITE_OTHER): Payer: Federal, State, Local not specified - PPO | Admitting: Internal Medicine

## 2019-06-13 ENCOUNTER — Other Ambulatory Visit: Payer: Self-pay

## 2019-06-13 ENCOUNTER — Encounter: Payer: Self-pay | Admitting: Internal Medicine

## 2019-06-13 VITALS — BP 120/90 | HR 70 | Temp 98.7°F | Ht 63.0 in | Wt 187.0 lb

## 2019-06-13 DIAGNOSIS — Z23 Encounter for immunization: Secondary | ICD-10-CM

## 2019-06-13 DIAGNOSIS — M25561 Pain in right knee: Secondary | ICD-10-CM | POA: Diagnosis not present

## 2019-06-13 NOTE — Progress Notes (Signed)
   Subjective:   Patient ID: Shannon Clay, adult    DOB: Nov 05, 1995, 23 y.o.   MRN: [id number]  HPI The patient is a 23 YO man coming in for burning knee pain in the right side. Started a couple of weeks ago. Having also some feelings of instability of the knee but no collapse. Having worse pain with kneeling. 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 Denies injury or fall. Denies swelling of the knee. Making work difficult due to 7/10 pain with kneeling. Tried otc tylenol and ibuprofen without significant relief.   Review of Systems  Constitutional: Negative.   HENT: Negative.   Eyes: Negative.   Respiratory: Negative for cough, chest tightness and shortness of breath.   Cardiovascular: Negative for chest pain, palpitations and leg swelling.  Gastrointestinal: Negative for abdominal distention, abdominal pain, constipation, diarrhea, nausea and vomiting.  Musculoskeletal: Positive for arthralgias and myalgias.  Skin: Negative.   Neurological: Negative.   Psychiatric/Behavioral: Negative.     Objective:  Physical Exam Constitutional:      Appearance: He is well-developed.  HENT:     Head: Normocephalic and atraumatic.  Neck:     Musculoskeletal: Normal range of motion.  Cardiovascular:     Rate and Rhythm: Normal rate and regular rhythm.  Pulmonary:     Effort: Pulmonary effort is normal. No respiratory distress.     Breath sounds: Normal breath sounds. No wheezing or rales.  Abdominal:     General: Bowel sounds are normal. There is no distension.     Palpations: Abdomen is soft.     Tenderness: There is no abdominal tenderness. There is no rebound.  Musculoskeletal:     Comments: Pain pre-patellar right knee, ACL and PCL intact, no real pain over LCL or MCL. Left knee normal. Neither knee with effusion  Skin:    General: Skin is warm and dry.  Neurological:     Mental Status: He is alert and oriented to person, place, and time.     Coordination: Coordination  normal.     Vitals:   06/13/19 1549  BP: 120/90  Pulse: 70  Temp: 98.7 F (37.1 C)  TempSrc: Oral  SpO2: 98%  Weight: 187 lb (84.8 kg)  Height: 5\' 3"  (1.6 m)    Assessment & Plan:  Flu shot given at visit

## 2019-06-13 NOTE — Patient Instructions (Signed)
Prepatellar Bursitis Rehab Ask your health care provider which exercises are safe for you. Do exercises exactly as told by your health care provider and adjust them as directed. It is normal to feel mild stretching, pulling, tightness, or discomfort as you do these exercises. Stop right away if you feel sudden pain or your pain gets worse. Do not begin these exercises until told by your health care provider. Stretching and range-of-motion exercises These exercises warm up your muscles and joints and improve the movement and flexibility of your knee. These exercises also help to relieve pain, numbness, and tingling. Hamstring stretch, standing This is an exercise in which you prop your leg on a chair and lean forward to achieve a stretch. To do this exercise: 1. Stand with your left / right heel resting on a chair. Your left / right leg should be fully extended. 2. Arch your lower back slightly. 3. Lean forward at the waist, leading with your chest, until you feel a gentle stretch in the back of your left / right knee or thigh. You should not need to lean far to feel the stretch. 4. Hold this position for __________ seconds. Repeat __________ times. Complete this exercise __________ times a day. Knee flexion, active  1. Lie on your back with both knees straight. If this causes back discomfort, bend your healthy knee so your foot is flat on the floor. 2. Slowly slide your left / right heel back toward your buttocks (knee flexion). Stop when you feel a gentle stretch in the front of your knee or thigh. 3. Hold this position for __________ seconds. 4. Slowly slide your left / right heel back to the starting position. Repeat __________ times. Complete this exercise __________ times a day. Strengthening exercises These exercises build strength and endurance in your knee. Endurance is the ability to use your muscles for a long time, even after they get tired. Quadriceps, isometric This exercise  stretches the muscles in the front of your thigh (quadriceps) without moving your knee joint (isometric). 1. Lie on your back with your left / right leg extended and your other knee bent. If told by your health care provider, put a rolled towel or a small pillow under your left / right knee. 2. Slowly tense the muscles in the front of your left / right thigh. You should see your kneecap slide up toward your hip or see increased dimpling just above your knee. This motion will push the back of your knee down toward the surface that is under it. 3. For __________ seconds, hold the muscles as tight as you can without increasing your pain. 4. Relax the muscles slowly and completely after each repetition. Repeat __________ times. Complete this exercise __________ times a day. Straight leg raises, supine This exercise is sometimes called a quadriceps and hip flexor exercise. 1. Lie on your back (supine position) with your left / right leg extended and your healthy knee bent. 2. Slowly tense the muscles in the front of your left / right thigh (quadriceps). You should see your kneecap slide up or see increased dimpling just above the knee. 3. Tighten these muscles even more as you raise your leg 4-6 inches (10-15 cm) off the floor. Do not let your knee bend. 4. Hold this position for __________ seconds. 5. Keep the thigh muscles tense as you lower your leg. 6. Relax your muscles slowly and completely after each repetition. Repeat __________ times. Complete this exercise __________ times a day. Straight leg  raises, prone This is an exercise in which you stretch the muscles in the front and back of your thigh (hip extensors). 1. Lie on your abdomen (prone position) on a firm surface. You can put a pillow under your hips if that is more comfortable for your lower back. 2. Squeeze your buttocks muscles and lift your left / right leg about 4-6 inches (10-15 cm). Keep your knee straight as you lift your leg. 3.  Hold this position for __________ seconds. 4. Slowly lower your leg to the starting position. 5. Let your muscles relax completely after each repetition. Repeat __________ times. Complete this exercise __________ times a day. This information is not intended to replace advice given to you by your health care provider. Make sure you discuss any questions you have with your health care provider. Document Released: 09/28/2005 Document Revised: 01/23/2019 Document Reviewed: 01/23/2019 Elsevier Patient Education  2020 [employer].

## 2019-06-13 NOTE — Progress Notes (Signed)
Patient ID: Shannon Clay, adult   DOB: 1996-01-30, 23 y.o.   MRN: [id number] Knee Arthrocentesis with Injection Procedure Note  Pre-operative Diagnosis: right knee pre-patelar bursitis  Post-operative Diagnosis: same  Indications: pain  Anesthesia: ethyl chloride spray    Procedure Details   Verbal consent was obtained for the procedure. The joint was prepped with alcohol and ethyl chloride spray used for numbing. A 22 gauge needle was inserted into the superior aspect of the joint from a lateral approach. 2 ml 1% lidocaine and 1 ml of depo-medrol 40 mg was then injected into the joint through the same needle. The needle was removed and the area cleansed and dressed.  Complications:  None; patient tolerated the procedure well.

## 2019-06-14 DIAGNOSIS — M25561 Pain in right knee: Secondary | ICD-10-CM | POA: Insufficient documentation

## 2019-06-14 NOTE — Assessment & Plan Note (Signed)
Given injection during visit for likely pre-patellar bursitis. Given stretching exercises to help.

## 2019-06-21 DIAGNOSIS — F341 Dysthymic disorder: Secondary | ICD-10-CM | POA: Diagnosis not present

## 2019-06-21 DIAGNOSIS — G47419 Narcolepsy without cataplexy: Secondary | ICD-10-CM | POA: Diagnosis not present

## 2019-07-25 NOTE — Progress Notes (Signed)
Subjective:    Patient ID: Shannon Clay, adult    DOB: 1996/08/07, 23 y.o.   MRN: [id number]  HPI The patient is here for an acute visit.   Back pain:   He is having lower back pain that radiates to his hips b/l.  It started two days ago.  He states the pain started when he bent down to grab a towel out of a cabinet.  He instantly felt severe lower back pain that radiated down his legs.  Any position he has discomfort. He has constant left leg pain and intermittent pain in his right leg.  He denies muscle spasms in the lower back.  He sometimes has a tingle in the left leg.  Both legs feels weak.    When he gets up in the morning he has difficulty getting out of bed and bending.  He denies any loss of control of his bladder or bowel.    He took naprosyn - it did not help much.  He did try ice and heat and that did not seem to help.  His pain is about a 8/10.    Medications and allergies reviewed with patient and updated if appropriate.  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 (Patient taking differently: Take 20 mg by mouth daily. ) 90 tablet  3   Diclofenac Potassium (CAMBIA) 50 MG PACK Take 50 mg at the onset of migraine. 1 each 5   modafinil (PROVIGIL) 200 MG tablet TAKE 1 TABLET EVERY DAY 30 tablet 5   ondansetron (ZOFRAN) 4 MG tablet Take 1 tablet (4 mg total) by mouth every 8 (eight) hours as needed for nausea or vomiting. 20 tablet 0   pantoprazole (PROTONIX) 40 MG tablet Take 1 tablet (40 mg total) by mouth daily. 90 tablet 3   testosterone cypionate (DEPOTESTOSTERONE CYPIONATE) 200 MG/ML injection INJECT 0.5 MLS INTO THE MUSCLE EVERY 14 (FOURTEEN) DAYS. 6 mL 1   No current facility-administered medications on file prior to visit.     Past Medical History:  Diagnosis Date   ADHD (attention deficit hyperactivity disorder)    Asthma    Depression    Frequent headaches    Migraine     History reviewed. No pertinent surgical history.  Social History   Socioeconomic History   Marital status: Single    Spouse name: Not on file   Number of children: Not on file   Years of education: Not on file   Highest education level: Not on file  Occupational History   Not on file  Social Needs   Financial resource strain: Not on file   Food insecurity    Worry: Not on file  Inability: Not on file   Transportation needs    Medical: Not on file    Non-medical: Not on file  Tobacco Use   Smoking status: Never Smoker   Smokeless tobacco: Never Used  Substance and Sexual Activity   Alcohol use: No    Comment: social   Drug use: No   Sexual activity: Never  Lifestyle   Physical activity    Days per week: Not on file    Minutes per session: Not on file   Stress: Not on file  Relationships   Social connections    Talks on phone: Not on file    Gets together: Not on file    Attends religious service: Not on file    Active member of club or organization: Not on file    Attends meetings of clubs or organizations: Not on file    Relationship status: Not on file  Other Topics Concern   Not  on file  Social History Narrative   Not on file    Family History  Problem Relation Age of Onset   ADD / ADHD Mother    Anxiety disorder Mother    Mental illness Mother    OCD Father    Anxiety disorder Sister    Depression Sister    Depression Paternal Aunt    Arthritis Maternal Grandmother     Review of Systems  Constitutional: Negative for chills and fever.  Gastrointestinal:       No bowel incontinence  Genitourinary:       No urinary incontinence  Musculoskeletal: Positive for back pain. Negative for myalgias.  Neurological: Positive for weakness. Negative for numbness.       Objective:   Vitals:   07/26/19 1317  BP: 138/72  Pulse: (!) 102  Resp: 16  Temp: 98.3 F (36.8 C)  SpO2: 98%   BP Readings from Last 3 Encounters:  07/26/19 138/72  06/13/19 120/90  05/04/19 114/78   Wt Readings from Last 3 Encounters:  07/26/19 185 lb (83.9 kg)  06/13/19 187 lb (84.8 kg)  05/04/19 189 lb (85.7 kg)   Body mass index is 32.77 kg/m.   Physical Exam Constitutional:      General: He is not in acute distress (And discomfort, especially with movement).    Appearance: Normal appearance. He is not ill-appearing.  HENT:     Head: Normocephalic and atraumatic.  Musculoskeletal:     Right lower leg: No edema.     Left lower leg: No edema.     Comments: Tenderness with palpation across lower back and into hips, no deformity of lower spine.  Increased pain with back extension.  Able to flex lumbar spine with minimal pain.  Neurological:     Comments: Normal sensation bilateral lower extremities.  Strength in lower extremities difficult to appreciate secondary to degree of pain.  Positive straight leg raise bilaterally.  Brisk DTRs of lower extremities  Psychiatric:        Mood and Affect: Mood normal.            Assessment & Plan:    See Problem List for Assessment and Plan of chronic medical problems.

## 2019-07-26 ENCOUNTER — Ambulatory Visit (INDEPENDENT_AMBULATORY_CARE_PROVIDER_SITE_OTHER)
Admission: RE | Admit: 2019-07-26 | Discharge: 2019-07-26 | Disposition: A | Discharge status: Home / Self Care | Payer: Federal, State, Local not specified - PPO | Source: Ambulatory Visit | Attending: Internal Medicine | Admitting: Internal Medicine

## 2019-07-26 ENCOUNTER — Other Ambulatory Visit: Payer: Self-pay

## 2019-07-26 ENCOUNTER — Encounter: Payer: Self-pay | Admitting: Internal Medicine

## 2019-07-26 ENCOUNTER — Ambulatory Visit (INDEPENDENT_AMBULATORY_CARE_PROVIDER_SITE_OTHER): Payer: Federal, State, Local not specified - PPO | Admitting: Internal Medicine

## 2019-07-26 VITALS — BP 138/72 | HR 102 | Temp 98.3°F | Resp 16 | Ht 63.0 in | Wt 185.0 lb

## 2019-07-26 DIAGNOSIS — M5442 Lumbago with sciatica, left side: Secondary | ICD-10-CM | POA: Diagnosis not present

## 2019-07-26 DIAGNOSIS — M545 Low back pain: Secondary | ICD-10-CM | POA: Diagnosis not present

## 2019-07-26 DIAGNOSIS — M5441 Lumbago with sciatica, right side: Secondary | ICD-10-CM

## 2019-07-26 MED ORDER — PREDNISONE 50 MG PO TABS: 50.0000 mg | ORAL_TABLET | Freq: Every day | ORAL | 0 refills | Status: DC

## 2019-07-26 MED ORDER — HYDROCODONE-ACETAMINOPHEN 5-325 MG PO TABS: 1.0000 | ORAL_TABLET | Freq: Four times a day (QID) | ORAL | 0 refills | Status: DC | PRN

## 2019-07-26 MED ORDER — GABAPENTIN 100 MG PO CAPS: 100.0000 mg | ORAL_CAPSULE | Freq: Every day | ORAL | 3 refills | Status: DC

## 2019-07-26 NOTE — Patient Instructions (Addendum)
Have an x-ray done today.     Medications reviewed and updated.  Changes include :   Prednisone for 5 days - take with food.   Gabapentin for the nerve pain - take at night.  Vicodin for severe pain - take only as needed.    Your prescription(s) have been submitted to your pharmacy. Please take as directed and contact our office if you believe you are having problem(s) with the medication(s).  A referral was ordered for orthopedics    Please call if there is no improvement in your symptoms.

## 2019-07-26 NOTE — Assessment & Plan Note (Signed)
Acute pain is started 2 days ago after bending down at work to grab a towel out of the cabinet Having pain across his lower back and down the left leg much more than the right leg.  Subjective weakness in the legs, occasional tingling in the left leg No muscle spasms or concerning neurological findings Lumbar x-ray today Will refer to orthopedics Prednisone 50 mg daily x5 days Gabapentin 100-200 mg at night-can titrate if needed Vicodin 5-325 for severe pain only.  Discussed side effects of all of these medications.  Discussed that we can adjust his medications if his pain is not controlled Advised that he call with any questions or concerns

## 2019-07-28 ENCOUNTER — Ambulatory Visit (INDEPENDENT_AMBULATORY_CARE_PROVIDER_SITE_OTHER): Payer: Federal, State, Local not specified - PPO | Admitting: Family Medicine

## 2019-07-28 ENCOUNTER — Other Ambulatory Visit: Payer: Self-pay

## 2019-07-28 ENCOUNTER — Encounter: Payer: Self-pay | Admitting: Family Medicine

## 2019-07-28 DIAGNOSIS — M5441 Lumbago with sciatica, right side: Secondary | ICD-10-CM | POA: Diagnosis not present

## 2019-07-28 DIAGNOSIS — M5442 Lumbago with sciatica, left side: Secondary | ICD-10-CM | POA: Diagnosis not present

## 2019-07-28 NOTE — Progress Notes (Signed)
Office Visit Note   Patient: Shannon Clay           Date of Birth: 04/10/1996           MRN: [id number] Visit Date: 07/28/2019 Requested by: Binnie Rail, MD [place],  [place] [address] PCP: Hoyt Koch, MD  Subjective: Chief Complaint  Patient presents with  . Lower Back - Pain    HPI: Shannon Clay is here with low back pain.  Onset a few days ago, he was at work and simply bent forward to get something and felt a pop in his back with immediate severe pain.  He has continued to have pain since then with numbness and tingling intermittently down each leg.  Denies bowel or bladder dysfunction, denies any weakness in his legs.  He cannot find a comfortable position.  He went to his PCP and had x-rays obtained, he was given prednisone, gabapentin and hydrocodone and these have been helping.  He has been out of work since his injury.  Denies any previous problems with his back.               ROS: No fevers/chills.  All other systems were reviewed and are negative.  Objective: Vital Signs: There were no vitals taken for this visit.  Physical Exam:  General:  Alert and oriented, in no acute distress. Pulm:  Breathing unlabored. Psy:  Normal mood, congruent affect. Skin:  No rash.  Low back: No scoliosis.  Very tender to palpation over the L4-5 and L5-S1 levels.  Moderate tenderness in the gluteus medius areas bilaterally.  No pain in the sciatic notch, negative straight leg raise.  Lower extremity strength and reflexes are normal, reflexes are hyperactive and symmetric.  Imaging: Recent lumbar x-ray to my interpretation show mild disc space narrowing at L5-S1.  Otherwise unremarkable.  Assessment & Plan: 1.  Acute low back pain with bilateral sciatica, concerning for disc protrusion.  Neurologic exam is nonfocal. -Continue with medications, physical therapy referral made, home exercises given.  Wrote him a note to be out of work for another week in case he is not  better over the weekend.  If he fails to improve with conservative management we will order MRI scan.     Procedures: No procedures performed  No notes on file     PMFS History: Patient Active Problem List   Diagnosis Date Noted  . Acute bilateral low back pain with bilateral sciatica 07/26/2019  . Acute pain of right knee 06/14/2019  . Epigastric pain 05/04/2019  . Left foot pain 02/28/2019  . Routine general medical examination at a health care facility 10/21/2018  . Hormone imbalance 07/16/2017  . 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 Abnormal dreams 05/17/2017  . Sleep-related hallucinations 05/17/2017  . Excessive daytime sleepiness 05/17/2017  . Caffeine abuse, continuous ([place]) 05/17/2017  . Asthma 02/18/2017  . Migraine 02/18/2017  . Severe episode of recurrent major depressive disorder, without psychotic features ([place]) 02/18/2017  . ADHD (attention deficit hyperactivity disorder) 10/19/2011  . GAD (generalized anxiety disorder) 10/19/2011   Past Medical History:  Diagnosis Date  . ADHD (attention deficit hyperactivity disorder)   . Asthma   . Depression   . Frequent headaches   . Migraine     Family History  Problem Relation Age of Onset  . ADD / ADHD Mother   . Anxiety disorder Mother   . Mental illness Mother   . OCD Father   . Anxiety  disorder Sister   . Depression Sister   . Depression Paternal Aunt   . Arthritis Maternal Grandmother     No past surgical history on file. Social History   Occupational History  . Not on file  Tobacco Use  . Smoking status: Never Smoker  . Smokeless tobacco: Never Used  Substance and Sexual Activity  . Alcohol use: No    Comment: social  . Drug use: No  . Sexual activity: Never

## 2019-07-28 NOTE — Progress Notes (Signed)
Lower back pain x4 days. At work and just simply leaned over to get something and had sharp pain, radiating down both legs. Not comfortable sitting/standing/laying too long; "just very uncomfortable" PCP obtained some xrays

## 2019-08-02 DIAGNOSIS — G47419 Narcolepsy without cataplexy: Secondary | ICD-10-CM | POA: Diagnosis not present

## 2019-08-02 DIAGNOSIS — F341 Dysthymic disorder: Secondary | ICD-10-CM | POA: Diagnosis not present

## 2019-08-23 ENCOUNTER — Ambulatory Visit: Payer: Federal, State, Local not specified - PPO | Attending: Family Medicine | Admitting: Physical Therapy

## 2019-08-23 ENCOUNTER — Other Ambulatory Visit: Payer: Self-pay

## 2019-08-23 ENCOUNTER — Encounter: Payer: Self-pay | Admitting: Physical Therapy

## 2019-08-23 DIAGNOSIS — M6281 Muscle weakness (generalized): Secondary | ICD-10-CM | POA: Insufficient documentation

## 2019-08-23 DIAGNOSIS — M62838 Other muscle spasm: Secondary | ICD-10-CM | POA: Diagnosis not present

## 2019-08-23 DIAGNOSIS — M5440 Lumbago with sciatica, unspecified side: Secondary | ICD-10-CM | POA: Diagnosis not present

## 2019-08-23 NOTE — Therapy (Signed)
[facility] [address], [place], [place], [address] Phone: [phone]   Fax:  [phone]  Physical Therapy Treatment  Patient Details  Name: Shannon Clay MRN: [id number] Date of Birth: [birth year] Referring Provider (PT): Cr. Eunice Blase   Encounter Date: 08/23/2019  PT End of Session - 08/23/19 1556    Visit Number  1    Date for PT Re-Evaluation  10/18/19    Authorization Type  BCBS FED    PT Start Time  [id number]    PT Stop Time  1525    PT Time Calculation (min)  40 min    Activity Tolerance  Patient tolerated treatment well;No increased pain    Behavior During Therapy  WFL for tasks assessed/performed       Past Medical History:  Diagnosis Date  . ADHD (attention deficit hyperactivity disorder)   . Asthma   . Depression   . Frequent headaches   . Migraine     History reviewed. No pertinent surgical history.  There were no vitals filed for this visit.  Subjective Assessment - 08/23/19 1452    Subjective  07/24/2019 he leaned to get a towel out of the sink at work and felt a pain in low back. Patient has good day and bad days. NO numbness or tingling in legs.    Patient Stated Goals  reduce pain    Currently in Pain?  Yes    Pain Score  7     Pain Location  Back    Pain Orientation  Lower    Pain Descriptors / Indicators  Sharp;Shooting    Pain Type  Acute pain    Pain Radiating Towards  sometimes the pain will go down his legs    Pain Onset  More than a month ago    Pain Frequency  Intermittent    Aggravating Factors   laying on back, lifting heavy animals, bending down at work    Pain Relieving Factors  Stretches, Mckensie protocol, anti-inflammatory medication    Multiple Pain Sites  No         [facility] PT Assessment - 08/23/19 0001      Assessment   Medical Diagnosis  M54.42; M54.41 Acute bilateral low back pain with bilateral sciatica    Referring Provider (PT)  Cr. Legrand Como Hilts    Onset Date/Surgical Date  07/24/19    Prior Therapy  none      Precautions   Precautions  None      Restrictions   Weight Bearing Restrictions  No      Balance Screen   Has the patient fallen in the past 6 months  No    Has the patient had a decrease in activity level because of a fear of falling?   No    Is the patient reluctant to leave their home because of a fear of falling?   No      Home Film/video editor residence      Prior Function   Level of Independence  Independent    Vocation  Full time employment    Vocation Requirements  liftng, bending      Cognition   Overall Cognitive Status  Within Functional Limits for tasks assessed      Observation/Other Assessments   Focus on Therapeutic Outcomes (FOTO)   50% limitation; goal is 33% limitation      Posture/Postural Control   Posture/Postural Control  No significant  limitations      ROM / Strength   AROM / PROM / Strength  AROM;PROM;Strength      AROM   Lumbar Flexion  decreased by 25%    Lumbar Extension  decreased by 25%    Lumbar - Right Side Bend  decreased by 50% with no C curve      Strength   Right Hip Extension  5/5    Right Hip ABduction  4/5    Left Hip Extension  5/5    Left Hip ABduction  5/5      Palpation   Spinal mobility  decreased movement of L1-L5    SI assessment   pelvis in correct alignment                   [facility] Adult PT Treatment/Exercise - 08/23/19 0001      Lumbar Exercises: Stretches   Active Hamstring Stretch  Right;Left;1 rep;30 seconds   supine   Single Knee to Chest Stretch  Right;Left;1 rep;20 seconds             PT Education - 08/23/19 1525    Education Details  Access Code: [email or web address]    Person(s) Educated  Patient    Methods  Explanation;Demonstration;Verbal cues;Handout    Comprehension  Returned demonstration;Verbalized understanding       PT Short Term Goals - 08/23/19 1557      PT SHORT TERM GOAL #1   Title   independent with initial HEP    Time  4    Period  Weeks    Status  New    Target Date  09/20/19      PT SHORT TERM GOAL #2   Title  able to demonstrate correct body mechanincs with work and home tasks    Time  4    Period  Weeks    Status  New    Target Date  09/20/19        PT Long Term Goals - 08/23/19 1521      PT LONG TERM GOAL #1   Title  independent with advanced HEP    Time  8    Period  Weeks    Status  New    Target Date  10/18/19      PT LONG TERM GOAL #2   Title  pain with lifting animals with correct body mechanics >/= 75%    Time  8    Period  Weeks    Status  New    Target Date  10/18/19      PT LONG TERM GOAL #3   Title  sleep with minimal to no pain due to improved mobility of spine    Time  8    Period  Weeks    Status  New    Target Date  10/18/19      PT LONG TERM GOAL #4   Title  tightness in lumbar with daily activities decreased >/= 75% due to improved flexibility    Time  8    Period  Weeks    Status  New    Target Date  10/18/19      PT LONG TERM GOAL #5   Title  FOTO score </= 33% limitation    Time  8    Period  Weeks    Status  New    Target Date  10/18/19            Plan - 08/23/19 1525  Clinical Impression Statement  Patient is a 23 year old individual with lumbar pain when they bent down to reach for a towel at work. Patient reports intermittent pain at level 7/10 with lifting, bending down, sleeping and daily tasks. Lumbar flexion decresaed by 25%, extension decreased by 25%, and right sidebending decreased by 50%. Paitent hip abductors and extensors strength is 4/5. Patient has decreased mobility of L1-L5. When patient extends his spine there is increased pain in L5-S1 area. When patient sidebends to the right there is a cecreased in C curve in lumbar. 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 Patient will benefit from skilled  therapy to improve lumbar mobility and reduce pain to improve function.    Personal Factors and Comorbidities  Comorbidity 1;Fitness;Profession    Comorbidities  Narcolepsy    Examination-Activity Limitations  Bend;Lift;Reach Overhead    Stability/Clinical Decision Making  Evolving/Moderate complexity    Clinical Decision Making  Low    Rehab Potential  Excellent    PT Frequency  1x / week    PT Duration  6 weeks    PT Treatment/Interventions  Cryotherapy;Electrical Stimulation;Iontophoresis 4mg /ml Dexamethasone;Moist Heat;Traction;Ultrasound;Therapeutic activities;Therapeutic exercise;Patient/family education;Neuromuscular re-education;Manual techniques;Dry needling;Spinal Manipulations    PT Next Visit Plan  joint mobilization to Lumbar; go over back education that was handed out, abdominal bracing, lifting mechanics with work tasks, soft tissue work, dry needling to lumbar paraspinals    PT Home Exercise Plan  Access Code: [email or web address]    Consulted and Agree with Plan of Care  Patient       Patient will benefit from skilled therapeutic intervention in order to improve the following deficits and impairments:  Decreased strength, Decreased mobility, Impaired flexibility, Decreased activity tolerance, Decreased endurance, Increased muscle spasms, Decreased range of motion  Visit Diagnosis: Acute bilateral low back pain with sciatica, sciatica laterality unspecified - Plan: PT plan of care cert/re-cert  Muscle weakness (generalized) - Plan: PT plan of care cert/re-cert  Other muscle spasm - Plan: PT plan of care cert/re-cert     Problem List Patient Active Problem List   Diagnosis Date Noted  . Acute bilateral low back pain with bilateral sciatica 07/26/2019  . Acute pain of right knee 06/14/2019  . Epigastric pain 05/04/2019  . Left foot pain 02/28/2019  . Routine general medical examination at a health care facility 10/21/2018  . Hormone imbalance 07/16/2017  . Cataplexy and  narcolepsy 05/17/2017  . Abnormal dreams 05/17/2017  . Sleep-related hallucinations 05/17/2017  . Excessive daytime sleepiness 05/17/2017  . Caffeine abuse, continuous ([place]) 05/17/2017  . Asthma 02/18/2017  . Migraine 02/18/2017  . Severe episode of recurrent major depressive disorder, without psychotic features ([place]) 02/18/2017  . ADHD (attention deficit hyperactivity disorder) 10/19/2011  . GAD (generalized anxiety disorder) 10/19/2011    Earlie Counts, PT 08/23/19 4:01 PM   [facility] [address], [place], [place], [address] Phone: [phone]   Fax:  [phone]  Name: Shannon Clay MRN: [id number] Date of Birth: [birth year]

## 2019-08-23 NOTE — Patient Instructions (Signed)
Access Code: [email or web address]  URL: [email or web address]  Date: 08/23/2019  Prepared by: Earlie Counts   Exercises Supine Figure 4 Piriformis Stretch - 1 reps - 1 sets - 30 sec hold - 1x daily - 7x weekly Supine Hamstring Stretch - 1 reps - 1 sets - 30 sec hold - 1x daily - 7x weekly Supine Lower Trunk Rotation - 1 reps - 1 sets - 15 sec hold - 1x daily - 7x weekly Patient Education Managing Back Pain Lumbar Strain Lifting Techniques Low Back Pain Low Back Pain Handout [facility] [address] [place], [place] [address] Phone # [phone] Fax [phone]

## 2019-08-28 ENCOUNTER — Telehealth: Payer: Self-pay

## 2019-08-28 NOTE — Telephone Encounter (Signed)
Received vm from adj requesting the October office note be faxed to her. IC pt and got verbal auth to send note since we are currently not filing WC for him. Faxed her the last office note and PT script and left vm for her to call me back with the billing info

## 2019-08-30 ENCOUNTER — Encounter: Payer: Self-pay | Admitting: Physical Therapy

## 2019-08-30 ENCOUNTER — Ambulatory Visit: Payer: Federal, State, Local not specified - PPO | Admitting: Physical Therapy

## 2019-08-30 ENCOUNTER — Other Ambulatory Visit: Payer: Self-pay

## 2019-08-30 DIAGNOSIS — M5440 Lumbago with sciatica, unspecified side: Secondary | ICD-10-CM | POA: Diagnosis not present

## 2019-08-30 DIAGNOSIS — M6281 Muscle weakness (generalized): Secondary | ICD-10-CM

## 2019-08-30 DIAGNOSIS — M62838 Other muscle spasm: Secondary | ICD-10-CM | POA: Diagnosis not present

## 2019-08-30 NOTE — Therapy (Signed)
[facility] [address] [place], [place], [address] Phone: [phone]   Fax:  [phone]  Physical Therapy Treatment  Patient Details  Name: MEILANIE BENNINGTON MRN: [id number] Date of Birth: Aug 22, 1996 Referring Provider (PT): Cr. Eunice Blase   Encounter Date: 08/30/2019  PT End of Session - 08/30/19 1448    Visit Number  2    Date for PT Re-Evaluation  10/18/19    Authorization Type  BCBS FED    PT Start Time  1448    PT Stop Time  1530    PT Time Calculation (min)  42 min    Activity Tolerance  Patient tolerated treatment well    Behavior During Therapy  [facility] for tasks assessed/performed       Past Medical History:  Diagnosis Date  . ADHD (attention deficit hyperactivity disorder)   . Asthma   . Depression   . Frequent headaches   . Migraine     History reviewed. No pertinent surgical history.  There were no vitals filed for this visit.  Subjective Assessment - 08/30/19 1452    Subjective  I am feeling ok today but my day hasn't been very busy.    Currently in Pain?  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 Pt verbbally understtod all concepts and ideas. Pt felt wearing knee pads may greatly help to be able to knel better and get down closer to the animals.       Lumbar Exercises: Stretches   Active Hamstring Stretch  Right;Left;2 reps;30 seconds    Single Knee to Chest Stretch  Right;Left;2 reps;20 seconds    Single Knee to Chest Stretch Limitations  Vc to straighten opposite leg     Lower Trunk Rotation  --   Rock 10x, then static holds 2x 20 sec Bil   Other Lumbar Stretch Exercise  Childs pose stretch added to HEP      Lumbar Exercises: Aerobic   Nustep  L1 x 6  min end of session, MHP behind pt      Lumbar Exercises: Quadruped   Madcat/Old Horse  10 reps    Madcat/Old Horse Limitations  Added to HEP             PT Education - 08/30/19 1516    Education Details  Back protective body mechanics for work tasks, HEP progression : Cat/Cow and childs pose both in quadruped and standing    Person(s) Educated  Patient    Methods  Explanation;Demonstration;Tactile cues;Verbal cues;Handout    Comprehension  Returned demonstration;Verbalized understanding       PT Short Term Goals - 08/23/19 1557      PT SHORT TERM GOAL #1   Title  independent with initial HEP    Time  4    Period  Weeks    Status  New    Target Date  09/20/19      PT SHORT TERM GOAL #2   Title  able to demonstrate correct body mechanincs with work and home tasks    Time  4    Period  Weeks  Status  New    Target Date  09/20/19        PT Long Term Goals - 08/23/19 1521      PT LONG TERM GOAL #1   Title  independent with advanced HEP    Time  8    Period  Weeks    Status  New    Target Date  10/18/19      PT LONG TERM GOAL #2   Title  pain with lifting animals with correct body mechanics >/= 75%    Time  8    Period  Weeks    Status  New    Target Date  10/18/19      PT LONG TERM GOAL #3   Title  sleep with minimal to no pain due to improved mobility of spine    Time  8    Period  Weeks    Status  New    Target Date  10/18/19      PT LONG TERM GOAL #4   Title  tightness in lumbar with daily activities decreased >/= 75% due to improved flexibility    Time  8    Period  Weeks    Status  New    Target Date  10/18/19      PT LONG TERM GOAL #5   Title  FOTO score </= 33% limitation    Time  8    Period  Weeks    Status  New    Target Date  10/18/19            Plan - 08/30/19 1453    Clinical Impression Statement  Pt arrives with no pain today. Reports the day has been rather "easy" so far. Pt is compliant and independent with initial  HEP. Pt remains stiff along the spine, hence spinal mobility exercises were given for new HEP. This did bring upon a little pain in the right lumbar, but not terrible.    Personal Factors and Comorbidities  Comorbidity 1;Fitness;Profession    Comorbidities  Narcolepsy    Examination-Activity Limitations  Bend;Lift;Reach Overhead    Stability/Clinical Decision Making  Evolving/Moderate complexity    Rehab Potential  Excellent    PT Frequency  1x / week    PT Duration  6 weeks    PT Treatment/Interventions  Cryotherapy;Electrical Stimulation;Iontophoresis 4mg /ml Dexamethasone;Moist Heat;Traction;Ultrasound;Therapeutic activities;Therapeutic exercise;Patient/family education;Neuromuscular re-education;Manual techniques;Dry needling;Spinal Manipulations    PT Next Visit Plan  See how work is going, assess pain, review new exercises given today.    PT Home Exercise Plan  Access Code: [email or web address]    Consulted and Agree with Plan of Care  Patient       Patient will benefit from skilled therapeutic intervention in order to improve the following deficits and impairments:  Decreased strength, Decreased mobility, Impaired flexibility, Decreased activity tolerance, Decreased endurance, Increased muscle spasms, Decreased range of motion  Visit Diagnosis: Muscle weakness (generalized)  Acute bilateral low back pain with sciatica, sciatica laterality unspecified  Other muscle spasm     Problem List Patient Active Problem List   Diagnosis Date Noted  . Acute bilateral low back pain with bilateral sciatica 07/26/2019  . Acute pain of right knee 06/14/2019  . Epigastric pain 05/04/2019  . Left foot pain 02/28/2019  . Routine general medical examination at a health care facility 10/21/2018  . Hormone imbalance 07/16/2017  . 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 Abnormal dreams 05/17/2017  . Sleep-related hallucinations 05/17/2017  . Excessive daytime  sleepiness 05/17/2017  . Caffeine abuse,  continuous ([place]) 05/17/2017  . Asthma 02/18/2017  . Migraine 02/18/2017  . Severe episode of recurrent major depressive disorder, without psychotic features ([place]) 02/18/2017  . ADHD (attention deficit hyperactivity disorder) 10/19/2011  . GAD (generalized anxiety disorder) 10/19/2011    Baylee Mccorkel, PTA 08/30/2019, 3:32 PM  [facility] [address], [place], [place], [address] Phone: [phone]   Fax:  [phone]  Name: KAYANNA RIDINGER MRN: [id number] Date of Birth: August 20, 1996  Access Code: [email or web address]  URL: [email or web address]  Date: 08/30/2019  Prepared by: Myrene Galas   Exercises  Supine Figure 4 Piriformis Stretch - 1 reps - 1 sets - 30 sec hold - 1x daily - 7x weekly  Supine Hamstring Stretch - 1 reps - 1 sets - 30 sec hold - 1x daily - 7x weekly  Supine Lower Trunk Rotation - 1 reps - 1 sets - 15 sec hold - 1x daily - 7x weekly  Cat-Camel to Child's Pose - 10 reps - 1 sets - 2x daily - 7x weekly  Child's Pose Stretch - 1 reps - 1 sets - 30 hold - 2x daily - 7x weekly  Patient Education  Managing Back Pain  Lumbar Strain  Lifting Techniques  Low Back Pain  Low Back Pain Handout

## 2019-09-06 ENCOUNTER — Other Ambulatory Visit: Payer: Self-pay

## 2019-09-06 ENCOUNTER — Ambulatory Visit (INDEPENDENT_AMBULATORY_CARE_PROVIDER_SITE_OTHER): Payer: Federal, State, Local not specified - PPO | Admitting: Internal Medicine

## 2019-09-06 ENCOUNTER — Encounter: Payer: Self-pay | Admitting: Internal Medicine

## 2019-09-06 ENCOUNTER — Other Ambulatory Visit: Payer: Federal, State, Local not specified - PPO

## 2019-09-06 VITALS — BP 126/86 | HR 80 | Temp 98.7°F | Resp 16 | Ht 63.0 in | Wt 184.0 lb

## 2019-09-06 DIAGNOSIS — L02416 Cutaneous abscess of left lower limb: Secondary | ICD-10-CM

## 2019-09-06 MED ORDER — DOXYCYCLINE HYCLATE 100 MG PO TABS: 100.0000 mg | ORAL_TABLET | Freq: Two times a day (BID) | ORAL | 0 refills | Status: AC

## 2019-09-06 NOTE — Progress Notes (Signed)
Subjective:  Patient ID: Shannon Clay, adult    DOB: [birth year]  Age: 23 y.o. MRN: [id number]  CC: Abscess  This visit occurred during the SARS-CoV-2 public health emergency.  Safety protocols were in place, including screening questions prior to the visit, additional usage of staff PPE, and extensive cleaning of exam room while observing appropriate contact time as indicated for disinfecting solutions.    HPI Shannon Clay presents for concerns about an area on the left upper inner thigh.  He tells me for a week there has been a red, painful, swollen area that occasionally drains yellow exudate.  Outpatient Medications Prior to Visit  Medication Sig Dispense Refill  . buPROPion (WELLBUTRIN XL) 150 MG 24 hr tablet     . citalopram (CELEXA) 20 MG tablet Take 1 tablet (20 mg total) by mouth daily. (Patient taking differently: Take 20 mg by mouth daily. ) 90 tablet 3  . citalopram (CELEXA) 40 MG tablet     . Diclofenac Potassium (CAMBIA) 50 MG PACK Take 50 mg at the onset of migraine. 1 each 5  . gabapentin (NEURONTIN) 100 MG capsule Take 1-2 capsules (100-200 mg total) by mouth at bedtime. 60 capsule 3  . HYDROcodone-acetaminophen (NORCO/VICODIN) 5-325 MG tablet Take 1-2 tablets by mouth every 6 (six) hours as needed for moderate pain. 30 tablet 0  . modafinil (PROVIGIL) 200 MG tablet TAKE 1 TABLET EVERY DAY 30 tablet 5  . ondansetron (ZOFRAN) 4 MG tablet Take 1 tablet (4 mg total) by mouth every 8 (eight) hours as needed for nausea or vomiting. 20 tablet 0  . pantoprazole (PROTONIX) 40 MG tablet Take 1 tablet (40 mg total) by mouth daily. 90 tablet 3  . predniSONE (DELTASONE) 50 MG tablet Take 1 tablet (50 mg total) by mouth daily with breakfast. 5 tablet 0  . testosterone cypionate (DEPOTESTOSTERONE CYPIONATE) 200 MG/ML injection INJECT 0.5 MLS INTO THE MUSCLE EVERY 14 (FOURTEEN) DAYS. 6 mL 1   No facility-administered medications prior to visit.     ROS Review of Systems   Constitutional: Negative for chills, fatigue and fever.  Respiratory: Negative for cough, chest tightness and shortness of breath.   Cardiovascular: Negative for palpitations and leg swelling.  Gastrointestinal: Negative for abdominal pain and diarrhea.  Genitourinary: Negative for difficulty urinating.  Skin: Negative for color change.  Hematological: Negative for adenopathy. Does not bruise/bleed easily.    Objective:  BP 126/86 (BP Location: Left Arm, Patient Position: Sitting, Cuff Size: Normal)   Pulse 80   Temp 98.7 F (37.1 C) (Oral)   Resp 16   Ht 5\' 3"  (1.6 m)   Wt 184 lb (83.5 kg)   SpO2 98%   BMI 32.59 kg/m   BP Readings from Last 3 Encounters:  09/06/19 126/86  07/26/19 138/72  06/13/19 120/90    Wt Readings from Last 3 Encounters:  09/06/19 184 lb (83.5 kg)  07/26/19 185 lb (83.9 kg)  06/13/19 187 lb (84.8 kg)    Physical Exam Vitals signs reviewed. Exam conducted with a chaperone present (Stefannie).  Constitutional:      Appearance: Normal appearance.  HENT:     Mouth/Throat:     Mouth: Mucous membranes are moist.  Eyes:     General: No scleral icterus.    Conjunctiva/sclera: Conjunctivae normal.  Neck:     Musculoskeletal: Neck supple.  Cardiovascular:     Rate and Rhythm: Normal rate and regular rhythm.     Heart sounds: No murmur.  Pulmonary:     Effort: Pulmonary effort is normal.     Breath sounds: No stridor. No wheezing, rhonchi or rales.  Abdominal:     General: Abdomen is flat.     Palpations: There is no mass.     Tenderness: There is no abdominal tenderness.  Genitourinary:   Lymphadenopathy:     Cervical: No cervical adenopathy.  Neurological:     General: No focal deficit present.     Mental Status: He is alert.     Lab Results  Component Value Date   WBC 8.1 05/04/2019   HGB 15.6 (H) 05/04/2019   HCT 47.1 (H) 05/04/2019   PLT 293.0 05/04/2019   GLUCOSE 100 (H) 05/04/2019   CHOL 161 02/18/2017   TRIG 75.0  02/18/2017   HDL 49.60 02/18/2017   LDLCALC 97 02/18/2017   ALT 34 05/04/2019   AST 20 05/04/2019   NA 139 05/04/2019   K 4.3 05/04/2019   CL 105 05/04/2019   CREATININE 0.81 05/04/2019   BUN 13 05/04/2019   CO2 28 05/04/2019   TSH 2.21 02/18/2017    Dg Lumbar Spine Complete  Result Date: 07/27/2019 CLINICAL DATA:  Acute low back pain EXAM: LUMBAR SPINE - COMPLETE 4+ VIEW COMPARISON:  None. FINDINGS: There is no evidence of lumbar spine fracture. Alignment is normal. Intervertebral disc spaces are maintained. IMPRESSION: Negative. Electronically Signed   By: Donavan Foil M.D.   On: 07/27/2019 02:15    After informed verbal consent was obtained, using Betadine for cleansing and 1% Lidocaine with epinephrine for anesthetic (2 cc used), with sterile technique a 4 mm punch biopsy was used to open the lesion.  A cavity  That tracts several centimeters each way was identified and explored with hydrogen peroxide on Q-tips.  A culture was obtained and has been sent.  There is no purulent exudate.  Hemostasis was obtained by pressure.  The cavity was packed with iodoform.  The procedure was well tolerated without complications.   Assessment & Plan:   Eleesha was seen today for abscess.  Diagnoses and all orders for this visit:  Abscess of left thigh- Incision and drainage completed.  I am concerned this might be MRSA so I have asked him to take a course of doxycycline.  The culture is pending.  He will remove the packing in 48 to 72 hours.  He will let me know if the area becomes symptomatic.  I have asked him return here in 5 to 7 days for me to recheck the area. -     Wound culture; Future -     doxycycline (VIBRA-TABS) 100 MG tablet; Take 1 tablet (100 mg total) by mouth 2 (two) times daily for 10 days.   I am having [name] start on doxycycline. I am also having him maintain his citalopram, Diclofenac Potassium(Migraine), testosterone cypionate, modafinil, pantoprazole,  ondansetron, predniSONE, gabapentin, HYDROcodone-acetaminophen, buPROPion, and citalopram.  Meds ordered this encounter  Medications  . doxycycline (VIBRA-TABS) 100 MG tablet    Sig: Take 1 tablet (100 mg total) by mouth 2 (two) times daily for 10 days.    Dispense:  20 tablet    Refill:  0     Follow-up: No follow-ups on file.  Scarlette Calico, MD

## 2019-09-06 NOTE — Patient Instructions (Addendum)
Incision and Drainage Incision and drainage is a surgical procedure to open and drain a fluid-filled sac. The sac may be filled with pus, mucus, or blood. Examples of fluid-filled sacs that may need surgical drainage include cysts, skin infections (abscesses), and red lumps that develop from a ruptured cyst or a small abscess (boils). You may need this procedure if the affected area is large, painful, infected, or not healing well. Tell a health care provider about:  Any allergies you have.  All medicines you are taking, including vitamins, herbs, eye drops, creams, and over-the-counter medicines.  Any problems you or family members have had with anesthetic medicines.  Any blood disorders you have or have had.  Any surgeries you have had.  Any medical conditions you have or have had.  Whether you are pregnant or may be pregnant. What are the risks? Generally, this is a safe procedure. However, problems may occur, including:  Infection.  Bleeding.  Allergic reactions to medicines.  Scarring.  The cyst or abscess returns.  Damage to nerves or vessels. What happens before the procedure? Medicine Ask your health care provider about:  Changing or stopping your regular medicines. This is especially important if you are taking diabetes medicines or blood thinners.  Taking medicines such as aspirin and ibuprofen. These medicines can thin your blood. Do not take these medicines unless your health care provider tells you to take them.  Taking over-the-counter medicines, vitamins, herbs, and supplements. Tests You may have an exam or testing. These may include:  Ultrasound or other imaging tests to see how large or deep the fluid-filled sac is.  Blood tests to check for infection. General instructions  Follow instructions from your health care provider about eating or drinking restrictions.  Plan to have someone take you home from the hospital or clinic.  Ask your health care  provider whether a responsible adult should care for you for at least 24 hours after you leave the hospital or clinic. This is important.  You may get a tetanus shot.  Ask your health care provider: ? How your surgery site will be marked or identified. ? What steps will be taken to help prevent infection. These may include:  Removing hair at the surgery site.  Washing skin with a germ-killing soap.  Receiving antibiotic medicine. What happens during the procedure?   An IV may be inserted into one of your veins.  You will be given one or more of the following: ? A medicine to help you relax (sedative). ? A medicine to numb the area (local anesthetic). ? A medicine to make you fall asleep (general anesthetic).  An incision will be made in the top of the fluid-filled sac.  Pus, blood, and mucus will be squeezed out, and a syringe or tube (drain) may be used to empty more fluid from the sac.  Your health care provider will do one of the following. He or she may: ? Leave the drain in place for several weeks to drain more fluid. ? Stitch open the edges of the incision to make a long-term opening for drainage (marsupialization).  The inside of the sac may be washed out (irrigated) with a sterile solution and packed with gauze before it is covered with a bandage (dressing).  Your health care provider do a culture test of the drainage fluid. The procedure may vary among health care providers and hospitals. What happens after the procedure?  Your blood pressure, heart rate, breathing rate, and blood oxygen level   will be monitored often until you leave the hospital or clinic.  Do not drive for 24 hours if you were given a sedative during your procedure. Summary  Incision and drainage is a surgical procedure to open and drain a fluid-filled sac. The sac may be filled with pus, mucus, or blood.  Before the procedure, you may be given antibiotic medicine to treat or help prevent  infection.  During the procedure, an incision will be made in the top of the fluid-filled sac. Pus, blood, and mucus is squeezed out, and a syringe or tube (drain) may be used to empty more fluid from the sac.  The inside of the sac may be washed out (irrigated) with a sterile solution and packed with gauze before it is covered with a bandage (dressing). This information is not intended to replace advice given to you by your health care provider. Make sure you discuss any questions you have with your health care provider. Document Released: 03/24/2001 Document Revised: 08/29/2018 Document Reviewed: 08/29/2018 Elsevier Patient Education  2020 [employer]  

## 2019-09-09 LAB — WOUND CULTURE
MICRO NUMBER:: 1139358
SPECIMEN QUALITY:: ADEQUATE

## 2019-09-11 ENCOUNTER — Telehealth: Payer: Self-pay

## 2019-09-11 NOTE — Telephone Encounter (Signed)
Copied from [place] [phone] [phone]. Topic: General - Other >> Sep 11, 2019  1:10 PM Mcneil, Ja-Kwan wrote: Reason for CRM: Pt called and stated that the gabapentin (NEURONTIN) 100 MG capsule is not working and she would like to discuss with a nurse possibly increasing the dosage or other options.

## 2019-09-11 NOTE — Telephone Encounter (Signed)
I have not seen for this, looks like saw [facility] in October and then orthopedics. Can call orthopedics to see if they can adjust.

## 2019-09-11 NOTE — Telephone Encounter (Signed)
Patient informed of MD response and stated understanding  

## 2019-09-13 ENCOUNTER — Encounter: Payer: Self-pay | Admitting: Physical Therapy

## 2019-09-13 ENCOUNTER — Other Ambulatory Visit: Payer: Self-pay

## 2019-09-13 ENCOUNTER — Ambulatory Visit: Payer: Federal, State, Local not specified - PPO | Attending: Family Medicine | Admitting: Physical Therapy

## 2019-09-13 ENCOUNTER — Encounter: Payer: Self-pay | Admitting: Internal Medicine

## 2019-09-13 DIAGNOSIS — M5440 Lumbago with sciatica, unspecified side: Secondary | ICD-10-CM | POA: Diagnosis not present

## 2019-09-13 DIAGNOSIS — M6281 Muscle weakness (generalized): Secondary | ICD-10-CM | POA: Insufficient documentation

## 2019-09-13 DIAGNOSIS — M62838 Other muscle spasm: Secondary | ICD-10-CM | POA: Diagnosis not present

## 2019-09-13 NOTE — Therapy (Signed)
[facility] [address] [place], [place], [address] Phone: [phone]   Fax:  [phone]  Physical Therapy Treatment  Patient Details  Name: Shannon Clay MRN: [id number] Date of Birth: 04/15/1996 Referring Provider (PT): Cr. Eunice Blase   Encounter Date: 09/13/2019  PT End of Session - 09/13/19 1442    Visit Number  3    Date for PT Re-Evaluation  10/18/19    Authorization Type  [employer]    PT Start Time  [id number]   Pt sent home due to pain level   PT Stop Time  1515    PT Time Calculation (min)  34 min    Activity Tolerance  Patient limited by pain    Behavior During Therapy  [facility] for tasks assessed/performed       Past Medical History:  Diagnosis Date  . ADHD (attention deficit hyperactivity disorder)   . Asthma   . Depression   . Frequent headaches   . Migraine     History reviewed. No pertinent surgical history.  There were no vitals filed for this visit.  Subjective Assessment - 09/13/19 1445    Subjective  I have not been doing much work wise and I feel like I am back at square 1 as of 2 days ago. I started to get "sore" last week but two days ago my LT leg has been very painful, trouble getting & out of car is awful and I cannot sleep.    Currently in Pain?  Yes   LTLB 8/10, standing up pain will shoot into my back of thigh.   Aggravating Factors   standing    Pain Relieving Factors  not much as of last 2 days    Multiple Pain Sites  No                       [facility] Adult PT Treatment/Exercise - 09/13/19 0001      Lumbar Exercises: Stretches   Single Knee to Chest Stretch  Right;Left;2 reps;20 seconds    Single Knee to Chest Stretch Limitations  kept opposite knee straighter today secondary to pain levels.    Lower Trunk Rotation  --   small ROM to start then progressively as far as pt can go     Prone on Elbows Stretch  --   prone laying x 1 min, POE produced stabbing pain  in post LT    Prone on Elbows Stretch Limitations  went back to hooklying position    Figure 4 Stretch  3 reps;20 seconds    Other Lumbar Stretch Exercise  Sciatic n floss 10x                PT Short Term Goals - 08/23/19 1557      PT SHORT TERM GOAL #1   Title  independent with initial HEP    Time  4    Period  Weeks    Status  New    Target Date  09/20/19      PT SHORT TERM GOAL #2   Title  able to demonstrate correct body mechanincs with work and home tasks    Time  4    Period  Weeks    Status  New    Target Date  09/20/19        PT Long Term Goals - 08/23/19 1521      PT LONG TERM GOAL #1   Title  independent with advanced HEP    Time  8    Period  Weeks    Status  New    Target Date  10/18/19      PT LONG TERM GOAL #2   Title  pain with lifting animals with correct body mechanics >/= 75%    Time  8    Period  Weeks    Status  New    Target Date  10/18/19      PT LONG TERM GOAL #3   Title  sleep with minimal to no pain due to improved mobility of spine    Time  8    Period  Weeks    Status  New    Target Date  10/18/19      PT LONG TERM GOAL #4   Title  tightness in lumbar with daily activities decreased >/= 75% due to improved flexibility    Time  8    Period  Weeks    Status  New    Target Date  10/18/19      PT LONG TERM GOAL #5   Title  FOTO score </= 33% limitation    Time  8    Period  Weeks    Status  New    Target Date  10/18/19            Plan - 09/13/19 1449    Clinical Impression Statement  Pt arrives with significant Lt post hip pain with radicular pain that shoots through the posterio thigh when standing, prone and/or bending from standing position. Hooklying position was the only position pt could tolerate today. Pt had much difficulty getting up out of the chair in the waiting room in addition to the left hip give way once in standing. This happened every time pt came to standing. Trial of prone position. This sent  "stabbing" pain into hip and pt had to get out of this position. Pt had stopped taking medication. Encouraged pt to call MD if pain/symptoms did not begin to less overe the next few days with rest and resuming the medication that was prescribed. Pt agreed.    Personal Factors and Comorbidities  Comorbidity 1;Fitness;Profession    Comorbidities  Narcolepsy    Examination-Activity Limitations  Bend;Lift;Reach Overhead    Stability/Clinical Decision Making  Evolving/Moderate complexity    Rehab Potential  Excellent    PT Frequency  1x / week    PT Duration  6 weeks    PT Treatment/Interventions  Cryotherapy;Electrical Stimulation;Iontophoresis 4mg /ml Dexamethasone;Moist Heat;Traction;Ultrasound;Therapeutic activities;Therapeutic exercise;Patient/family education;Neuromuscular re-education;Manual techniques;Dry needling;Spinal Manipulations    PT Next Visit Plan  Asses pain and how things went after leaving todays session. Resume origianl plan if symptoms have resolved.    PT Home Exercise Plan  Access Code: [email or web address]    Consulted and Agree with Plan of Care  Patient       Patient will benefit from skilled therapeutic intervention in order to improve the following deficits and impairments:  Decreased strength, Decreased mobility, Impaired flexibility, Decreased activity tolerance, Decreased endurance, Increased muscle spasms, Decreased range of motion  Visit Diagnosis: Muscle weakness (generalized)  Acute bilateral low back pain with sciatica, sciatica laterality unspecified  Other muscle spasm     Problem List Patient Active Problem List   Diagnosis Date Noted  . Acute bilateral low back pain with bilateral sciatica 07/26/2019  . Acute pain of right knee 06/14/2019  . Epigastric pain 05/04/2019  . Left foot pain 02/28/2019  .  Routine general medical examination at a health care facility 10/21/2018  . Hormone imbalance 07/16/2017  . 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 Abnormal  dreams 05/17/2017  . Sleep-related hallucinations 05/17/2017  . Excessive daytime sleepiness 05/17/2017  . Caffeine abuse, continuous ([place]) 05/17/2017  . Asthma 02/18/2017  . Migraine 02/18/2017  . Severe episode of recurrent major depressive disorder, without psychotic features ([place]) 02/18/2017  . ADHD (attention deficit hyperactivity disorder) 10/19/2011  . GAD (generalized anxiety disorder) 10/19/2011    COCHRAN,JENNIFER, PTA 09/13/2019, 3:26 PM  [facility] [address] [place], [place], [address] Phone: [phone]   Fax:  [phone]  Name: Shannon Clay MRN: [id number] Date of Birth: Jun 20, 1996

## 2019-09-20 ENCOUNTER — Other Ambulatory Visit: Payer: Self-pay

## 2019-09-20 ENCOUNTER — Ambulatory Visit: Payer: Federal, State, Local not specified - PPO | Admitting: Physical Therapy

## 2019-09-20 ENCOUNTER — Encounter: Payer: Self-pay | Admitting: Physical Therapy

## 2019-09-20 DIAGNOSIS — M5440 Lumbago with sciatica, unspecified side: Secondary | ICD-10-CM

## 2019-09-20 DIAGNOSIS — M62838 Other muscle spasm: Secondary | ICD-10-CM | POA: Diagnosis not present

## 2019-09-20 DIAGNOSIS — M6281 Muscle weakness (generalized): Secondary | ICD-10-CM

## 2019-09-20 NOTE — Therapy (Signed)
[facility] [address] [place], [place], [address] Phone: [phone]   Fax:  [phone]  Physical Therapy Treatment  Patient Details  Name: Shannon Clay MRN: [id number] Date of Birth: [birth year] Referring Provider (PT): Cr. Eunice Blase   Encounter Date: 09/20/2019  PT End of Session - 09/20/19 1444    Visit Number  4    Date for PT Re-Evaluation  10/18/19    Authorization Type  [employer]BS FED    PT Start Time  1444    PT Stop Time  1530    PT Time Calculation (min)  46 min    Activity Tolerance  Patient tolerated treatment well    Behavior During Therapy  [facility] for tasks assessed/performed       Past Medical History:  Diagnosis Date  . ADHD (attention deficit hyperactivity disorder)   . Asthma   . Depression   . Frequent headaches   . Migraine     History reviewed. No pertinent surgical history.  There were no vitals filed for this visit.  Subjective Assessment - 09/20/19 1445    Subjective  A little rest and the heating pad helped reduce the pain and symptoms I was having. I am able to work ok.    Currently in Pain?  Yes    Pain Score  5     Pain Location  Back    Pain Orientation  Left;Lower    Pain Descriptors / Indicators  Sore    Aggravating Factors   I just have to be careful with my movements.    Pain Relieving Factors  heat                       [facility] Adult PT Treatment/Exercise - 09/20/19 0001      Lumbar Exercises: Stretches   Active Hamstring Stretch  Right;Left;2 reps;30 seconds    Single Knee to Chest Stretch  Right;Left;3 reps;10 seconds    Lower Trunk Rotation  3 reps;10 seconds   initial rock 10x slowly   Figure 4 Stretch  3 reps;20 seconds    Other Lumbar Stretch Exercise  Childs pose x 10 sec     Other Lumbar Stretch Exercise  Sciatic n floss 10x2       Lumbar Exercises: Aerobic   Nustep  L1 x 5 min end of session with MHP to LT low back      Lumbar  Exercises: Quadruped   Madcat/Old Horse  10 reps    Madcat/Old Horse Limitations  Extension some pain      Manual Therapy   Manual Traction  Trial of LTLE long leg traction pull   No increased pain but unsure if beneficial.              PT Short Term Goals - 08/23/19 1557      PT SHORT TERM GOAL #1   Title  independent with initial HEP    Time  4    Period  Weeks    Status  New    Target Date  09/20/19      PT SHORT TERM GOAL #2   Title  able to demonstrate correct body mechanincs with work and home tasks    Time  4    Period  Weeks    Status  New    Target Date  09/20/19        PT Long Term Goals - 08/23/19 1521  PT LONG TERM GOAL #1   Title  independent with advanced HEP    Time  8    Period  Weeks    Status  New    Target Date  10/18/19      PT LONG TERM GOAL #2   Title  pain with lifting animals with correct body mechanics >/= 75%    Time  8    Period  Weeks    Status  New    Target Date  10/18/19      PT LONG TERM GOAL #3   Title  sleep with minimal to no pain due to improved mobility of spine    Time  8    Period  Weeks    Status  New    Target Date  10/18/19      PT LONG TERM GOAL #4   Title  tightness in lumbar with daily activities decreased >/= 75% due to improved flexibility    Time  8    Period  Weeks    Status  New    Target Date  10/18/19      PT LONG TERM GOAL #5   Title  FOTO score </= 33% limitation    Time  8    Period  Weeks    Status  New    Target Date  10/18/19            Plan - 09/20/19 1445    Clinical Impression Statement  Pt is better than last week but still moves cautiously especially transition movements. Pt thinks she will need a few more visits as the recovery appears slow. Pt's LTLE much tighter than the RT today, PTA encouraged pt to stretch the LTLE more than the RT at home. Pt trying to keep loads close while at work when lifting and carrying as this helps the back not spasm. Pt also resumed  taking the medication prescribed by he MD. Patient reports this has also been helpful. Pt was able to tolerate supine gentle stabilzation exercises that we could not do last week. These were given for HEP today. Pain was down to a 2-3/10 at end of session.    Personal Factors and Comorbidities  Comorbidity 1;Fitness;Profession    Comorbidities  Narcolepsy    Examination-Activity Limitations  Bend;Lift;Reach Overhead    Stability/Clinical Decision Making  Evolving/Moderate complexity    Rehab Potential  Excellent    PT Frequency  1x / week    PT Duration  6 weeks    PT Treatment/Interventions  Cryotherapy;Electrical Stimulation;Iontophoresis 4mg /ml Dexamethasone;Moist Heat;Traction;Ultrasound;Therapeutic activities;Therapeutic exercise;Patient/family education;Neuromuscular re-education;Manual techniques;Dry needling;Spinal Manipulations    PT Next Visit Plan  Pt will need to make more PT appts next week. Assess pain, see how pt is tolerating the supine stabilization exercises. Consider mobilizations to get lumbar moving if pt still not moving painfree.    PT Home Exercise Plan  Access Code: [email or web address]    Consulted and Agree with Plan of Care  Patient       Patient will benefit from skilled therapeutic intervention in order to improve the following deficits and impairments:  Decreased strength, Decreased mobility, Impaired flexibility, Decreased activity tolerance, Decreased endurance, Increased muscle spasms, Decreased range of motion  Visit Diagnosis: Muscle weakness (generalized)  Acute bilateral low back pain with sciatica, sciatica laterality unspecified  Other muscle spasm     Problem List Patient Active Problem List   Diagnosis Date Noted  . Acute bilateral low back pain with bilateral sciatica  07/26/2019  . Acute pain of right knee 06/14/2019  . Epigastric pain 05/04/2019  . Left foot pain 02/28/2019  . Routine general medical examination at a health care facility 10/21/2018   . Hormone imbalance 07/16/2017  . 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 Abnormal dreams 05/17/2017  . Sleep-related hallucinations 05/17/2017  . Excessive daytime sleepiness 05/17/2017  . Caffeine abuse, continuous ([place]) 05/17/2017  . Asthma 02/18/2017  . Migraine 02/18/2017  . Severe episode of recurrent major depressive disorder, without psychotic features ([place]) 02/18/2017  . ADHD (attention deficit hyperactivity disorder) 10/19/2011  . GAD (generalized anxiety disorder) 10/19/2011    Anaiya Wisinski, PTA 09/20/2019, 3:39 PM  [facility] [address] [place], [place], [address] Phone: [phone]   Fax:  [phone]  Name: LAHELA WAXLER MRN: [id number] Date of Birth: 09-11-1996  Access Code: [email or web address]  URL: [email or web address]  Date: 09/20/2019  Prepared by: Myrene Galas   Exercises  Supine Figure 4 Piriformis Stretch - 1 reps - 1 sets - 30 sec hold - 1x daily - 7x weekly  Supine Hamstring Stretch - 1 reps - 1 sets - 30 sec hold - 1x daily - 7x weekly  Supine Lower Trunk Rotation - 1 reps - 1 sets - 15 sec hold - 1x daily - 7x weekly  Cat-Camel to Child's Pose - 10 reps - 1 sets - 2x daily - 7x weekly  Child's Pose Stretch - 1 reps - 1 sets - 30 hold - 2x daily - 7x weekly  Supine Sciatic Nerve Glide - 10 reps - 1 sets - 2x daily - 7x weekly  Supine Posterior Pelvic Tilt - 10 reps - 1 sets - 5 hold - 2x daily - 7x weekly  Hooklying Transversus Abdominis Palpation - 10 reps - 1 sets - 5 hold - 2x daily - 7x weekly  Quadruped Transversus Abdominis Bracing - 10 reps - 1 sets - 5 hold - 2x daily - 7x weekly  Supine Transversus Abdominis Bracing with Double Leg Fallout - 10 reps - 1 sets - 2x daily - 7x weekly  Supine March with Posterior Pelvic Tilt - 10 reps - 1 sets - 2x daily - 7x weekly  Patient Education  Managing Back Pain  Lumbar Strain  Lifting Techniques  Low Back Pain  Low Back  Pain Handout

## 2019-09-27 ENCOUNTER — Ambulatory Visit: Payer: Federal, State, Local not specified - PPO | Admitting: Physical Therapy

## 2019-10-04 ENCOUNTER — Ambulatory Visit: Payer: Federal, State, Local not specified - PPO | Admitting: Physical Therapy

## 2019-10-18 ENCOUNTER — Ambulatory Visit: Payer: Federal, State, Local not specified - PPO | Admitting: Physical Therapy

## 2019-10-20 ENCOUNTER — Ambulatory Visit: Payer: Federal, State, Local not specified - PPO | Admitting: Internal Medicine

## 2019-10-25 ENCOUNTER — Other Ambulatory Visit: Payer: Self-pay

## 2019-10-25 ENCOUNTER — Encounter: Payer: Self-pay | Admitting: Physical Therapy

## 2019-10-25 ENCOUNTER — Ambulatory Visit: Payer: Federal, State, Local not specified - PPO | Attending: Family Medicine | Admitting: Physical Therapy

## 2019-10-25 DIAGNOSIS — M6281 Muscle weakness (generalized): Secondary | ICD-10-CM | POA: Diagnosis not present

## 2019-10-25 DIAGNOSIS — M62838 Other muscle spasm: Secondary | ICD-10-CM | POA: Diagnosis not present

## 2019-10-25 DIAGNOSIS — M5440 Lumbago with sciatica, unspecified side: Secondary | ICD-10-CM

## 2019-10-25 NOTE — Therapy (Signed)
[facility] [address] [place], [place], [address] Phone: [phone]   Fax:  [phone]  Physical Therapy Treatment  Patient Details  Name: Shannon Clay MRN: [id number] Date of Birth: February 01, 1996 Referring Provider (PT): Cr. Eunice Blase   Encounter Date: 10/25/2019  PT End of Session - 10/25/19 1434    Visit Number  5    Authorization Type  BCBS FED    PT Start Time  1400    PT Stop Time  1430    PT Time Calculation (min)  30 min    Activity Tolerance  Patient tolerated treatment well    Behavior During Therapy  [facility] for tasks assessed/performed       Past Medical History:  Diagnosis Date  . ADHD (attention deficit hyperactivity disorder)   . Asthma   . Depression   . Frequent headaches   . Migraine     History reviewed. No pertinent surgical history.  There were no vitals filed for this visit.  Subjective Assessment - 10/25/19 1402    Subjective  My back feels pretty good. I do not have pain. I have tightness and able to stretch it out.    Patient Stated Goals  reduce pain    Currently in Pain?  No/denies         [facility] PT Assessment - 10/25/19 0001      Assessment   Medical Diagnosis  M54.42; M54.41 Acute bilateral low back pain with bilateral sciatica    Referring Provider (PT)  Cr. Legrand Como Hilts    Onset Date/Surgical Date  07/24/19    Prior Therapy  none      Precautions   Precautions  None      Restrictions   Weight Bearing Restrictions  No      Home Environment   Living Environment  Private residence      Prior Function   Level of Independence  Independent    Vocation  Full time employment    Vocation Requirements  liftng, bending      Cognition   Overall Cognitive Status  Within Functional Limits for tasks assessed      Observation/Other Assessments   Focus on Therapeutic Outcomes (FOTO)   17% limitation      ROM / Strength   AROM / PROM / Strength  AROM;PROM;Strength       AROM   Lumbar Flexion  full    Lumbar Extension  full    Lumbar - Right Side Bend  full      Strength   Right Hip Extension  5/5    Right Hip ABduction  5/5    Left Hip Extension  5/5    Left Hip ABduction  5/5      Palpation   Spinal mobility  improved movement of thoracic and lumbar     SI assessment   pelvis in correct alignment                   [facility] Adult PT Treatment/Exercise - 10/25/19 0001      Lumbar Exercises: Stretches   Active Hamstring Stretch  Right;Left;1 rep;30 seconds    Active Hamstring Stretch Limitations  sitting    Double Knee to Chest Stretch  1 rep;30 seconds    Lower Trunk Rotation  2 reps;30 seconds    Lower Trunk Rotation Limitations  each side    Quadruped Mid Back Stretch  1 rep;30 seconds    Piriformis Stretch  Right;Left;1 rep;30 seconds    Piriformis Stretch Limitations  sitting      Lumbar Exercises: Supine   Bent Knee Raise  20 reps;1 second    Bent Knee Raise Limitations  with abdominal bracing    Bridge  15 reps;1 second      Lumbar Exercises: Quadruped   Madcat/Old Horse  10 reps      Manual Therapy   Manual Therapy  Joint mobilization    Joint Mobilization  PA mobilization to T4-L5 to work on some stiffness             PT Education - 10/25/19 1429    Education Details  Access Code: [email or web address]; reviewed body mechanics with work tasks    [employer]) Educated  Patient    Methods  Explanation;Demonstration;Verbal cues;Handout    Comprehension  Verbalized understanding;Returned demonstration       PT Short Term Goals - 10/25/19 1437      PT SHORT TERM GOAL #1   Title  independent with initial HEP    Time  4    Period  Weeks    Status  Achieved    Target Date  09/20/19      PT SHORT TERM GOAL #2   Title  able to demonstrate correct body mechanincs with work and home tasks    Time  4    Period  Weeks    Status  Achieved    Target Date  09/20/19        PT Long Term Goals - 10/25/19 1403      PT  LONG TERM GOAL #1   Title  independent with advanced HEP    Time  8    Period  Weeks    Status  Achieved      PT LONG TERM GOAL #2   Title  pain with lifting animals with correct body mechanics >/= 75%    Time  8    Period  Weeks    Status  Achieved      PT LONG TERM GOAL #3   Title  sleep with minimal to no pain due to improved mobility of spine    Baseline  does not wake up due to back pain    Time  8    Period  Weeks    Status  Achieved      PT LONG TERM GOAL #4   Title  tightness in lumbar with daily activities decreased >/= 75% due to improved flexibility    Time  8    Period  Weeks    Status  Achieved      PT LONG TERM GOAL #5   Title  FOTO score </= 33% limitation    Time  8    Period  Weeks    Status  Achieved            Plan - 10/25/19 1434    Clinical Impression Statement  Patient has met all of there goals. Patient has full lumbar ROM. Patient bilateral hip strength is 5/5. Patient reports not back pain. Today we have updated patients HEP. Patient verbally understands correct body mechanics with work tasks and being careful with lifting with his arms and legs. Patient is ready for discharge.    Personal Factors and Comorbidities  Comorbidity 1;Fitness;Profession    Comorbidities  Narcolepsy    Examination-Activity Limitations  Bend;Lift;Reach Overhead    Stability/Clinical Decision Making  Evolving/Moderate complexity    Rehab Potential  Excellent  PT Frequency  1x / week    PT Duration  6 weeks    PT Treatment/Interventions  Cryotherapy;Electrical Stimulation;Iontophoresis 63m/ml Dexamethasone;Moist Heat;Traction;Ultrasound;Therapeutic activities;Therapeutic exercise;Patient/family education;Neuromuscular re-education;Manual techniques;Dry needling;Spinal Manipulations    PT Next Visit Plan  Discharge to HEP today    PT Home Exercise Plan  Access Code: [email or web address]    Recommended Other Services  MD signed intial note    Consulted and Agree with Plan of  Care  Patient       Patient will benefit from skilled therapeutic intervention in order to improve the following deficits and impairments:  Decreased strength, Decreased mobility, Impaired flexibility, Decreased activity tolerance, Decreased endurance, Increased muscle spasms, Decreased range of motion  Visit Diagnosis: Muscle weakness (generalized)  Acute bilateral low back pain with sciatica, sciatica laterality unspecified  Other muscle spasm     Problem List Patient Active Problem List   Diagnosis Date Noted  . Acute bilateral low back pain with bilateral sciatica 07/26/2019  . Acute pain of right knee 06/14/2019  . Epigastric pain 05/04/2019  . Left foot pain 02/28/2019  . Routine general medical examination at a health care facility 10/21/2018  . Hormone imbalance 07/16/2017  . 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 Abnormal dreams 05/17/2017  . Sleep-related hallucinations 05/17/2017  . Excessive daytime sleepiness 05/17/2017  . Caffeine abuse, continuous ([place] 05/17/2017  . Asthma 02/18/2017  . Migraine 02/18/2017  . Severe episode of recurrent major depressive disorder, without psychotic features ([place] 02/18/2017  . ADHD (attention deficit hyperactivity disorder) 10/19/2011  . GAD (generalized anxiety disorder) 10/19/2011    CEarlie Counts PT 10/25/19 2:39 PM   [facility] [address] [place] [address]Phone: [phone]  Fax:  [phone] Name: Shannon OKAMRN: [id number] of Birth: 709-May-1997 PHYSICAL THERAPY DISCHARGE SUMMARY  Visits from Start of Care: 5  Current functional level related to goals / functional outcomes: See above.    Remaining deficits: See above.    Education / Equipment: HEP Plan: Patient agrees to discharge.  Patient goals were met. Patient is being discharged due to meeting the stated rehab goals.  Thank you for the referral. CEarlie Counts  PT 10/25/19 2:40 PM  ?????

## 2019-10-25 NOTE — Patient Instructions (Signed)
Access Code: [email or web address]  URL: [email or web address]  Date: 10/25/2019  Prepared by: Earlie Counts   Exercises Supine Lower Trunk Rotation - 1 reps - 1 sets - 15 sec hold - 1x daily - 7x weekly Cat-Camel to Child's Pose - 10 reps - 1 sets - 2x daily - 7x weekly Child's Pose Stretch - 1 reps - 1 sets - 30 hold - 2x daily - 7x weekly Supine Bridge - 10 reps - 3 sets - 1x daily - 7x weekly Supine March with Posterior Pelvic Tilt - 10 reps - 1 sets - 2x daily                            - 7x weekly Seated Piriformis Stretch with Trunk Bend - 2 reps - 1 sets - 30 sec hold - 1x daily - 7x weekly Seated Hamstring Stretch - 2 reps - 1 sets - 30 sec hold - 1x daily - 7x weekly Bird Dog - 10 reps - 3 sets - 1x daily - 7x weekly Patient Education Managing Back Pain Lumbar Strain Lifting Techniques Low Back Pain Low Back Pain Handout [facility] [address], [place],  [address] Phone # [phone] Fax [phone]

## 2019-11-14 ENCOUNTER — Other Ambulatory Visit: Payer: Self-pay | Admitting: Family

## 2019-11-14 ENCOUNTER — Other Ambulatory Visit: Payer: Self-pay

## 2019-11-14 ENCOUNTER — Encounter: Payer: Self-pay | Admitting: Family

## 2019-11-14 ENCOUNTER — Ambulatory Visit: Payer: Federal, State, Local not specified - PPO | Admitting: Family

## 2019-11-14 VITALS — BP 116/72 | HR 79 | Temp 98.0°F | Ht 63.0 in | Wt 186.2 lb

## 2019-11-14 DIAGNOSIS — R319 Hematuria, unspecified: Secondary | ICD-10-CM | POA: Diagnosis not present

## 2019-11-14 LAB — POC URINALSYSI DIPSTICK (AUTOMATED)
Bilirubin, UA: NEGATIVE
Glucose, UA: NEGATIVE
Ketones, UA: NEGATIVE
Nitrite, UA: NEGATIVE
Protein, UA: POSITIVE — AB
Spec Grav, UA: 1.02
Urobilinogen, UA: 0.2 U/dL
pH, UA: 7.5

## 2019-11-14 MED ORDER — NITROFURANTOIN MONOHYD MACRO 100 MG PO CAPS: 100.0000 mg | ORAL_CAPSULE | Freq: Two times a day (BID) | ORAL | 0 refills | Status: DC

## 2019-11-14 NOTE — Addendum Note (Signed)
Addended by: Marcina Millard on: 11/14/2019 01:03 PM   Modules accepted: Orders

## 2019-11-14 NOTE — Progress Notes (Signed)
Shannon Clay is a 24 y.o. adult with the following history as recorded in EpicCare:  Patient Active Problem List   Diagnosis Date Noted  . Acute bilateral low back pain with bilateral sciatica 07/26/2019  . Acute pain of right knee 06/14/2019  . Epigastric pain 05/04/2019  . Left foot pain 02/28/2019  . Routine general medical examination at a health care facility 10/21/2018  . Hormone imbalance 07/16/2017  . 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 Abnormal dreams 05/17/2017  . Sleep-related hallucinations 05/17/2017  . Excessive daytime sleepiness 05/17/2017  . Caffeine abuse, continuous ([place]) 05/17/2017  . Asthma 02/18/2017  . Migraine 02/18/2017  . Severe episode of recurrent major depressive disorder, without psychotic features ([place]) 02/18/2017  . ADHD (attention deficit hyperactivity disorder) 10/19/2011  . GAD (generalized anxiety disorder) 10/19/2011    Current Outpatient Medications  Medication Sig Dispense Refill  . buPROPion (WELLBUTRIN XL) 150 MG 24 hr tablet     . citalopram (CELEXA) 40 MG tablet     . Diclofenac Potassium (CAMBIA) 50 MG PACK Take 50 mg at the onset of migraine. 1 each 5  . gabapentin (NEURONTIN) 100 MG capsule Take 1-2 capsules (100-200 mg total) by mouth at bedtime. 60 capsule 3  . HYDROcodone-acetaminophen (NORCO/VICODIN) 5-325 MG tablet Take 1-2 tablets by mouth every 6 (six) hours as needed for moderate pain. 30 tablet 0  . modafinil (PROVIGIL) 200 MG tablet TAKE 1 TABLET EVERY DAY 30 tablet 5  . ondansetron (ZOFRAN) 4 MG tablet Take 1 tablet (4 mg total) by mouth every 8 (eight) hours as needed for nausea or vomiting. 20 tablet 0  . pantoprazole (PROTONIX) 40 MG tablet Take 1 tablet (40 mg total) by mouth daily. 90 tablet 3  . predniSONE (DELTASONE) 50 MG tablet Take 1 tablet (50 mg total) by mouth daily with breakfast. 5 tablet 0  . testosterone cypionate (DEPOTESTOSTERONE CYPIONATE) 200 MG/ML injection INJECT 0.5 MLS INTO THE MUSCLE EVERY 14  (FOURTEEN) DAYS. 6 mL 1  . nitrofurantoin, macrocrystal-monohydrate, (MACROBID) 100 MG capsule Take 1 capsule (100 mg total) by mouth 2 (two) times daily. 14 capsule 0   No current facility-administered medications for this visit.    Allergies: Latex and Sulfa antibiotics  Past Medical History:  Diagnosis Date  . ADHD (attention deficit hyperactivity disorder)   . Asthma   . Depression   . Frequent headaches   . Migraine     History reviewed. No pertinent surgical history.  Family History  Problem Relation Age of Onset  . ADD / ADHD Mother   . Anxiety disorder Mother   . Mental illness Mother   . OCD Father   . Anxiety disorder Sister   . Depression Sister   . Depression Paternal Aunt   . Arthritis Maternal Grandmother     Social History   Tobacco Use  . Smoking status: Never Smoker  . Smokeless tobacco: Never Used  Substance Use Topics  . Alcohol use: No    Comment: social    Subjective:  Presents with concerns for UTI; symptoms x 2 days; + blood in urine; + burning with urination- feels cramping sensation over bladder; no back pain, no fever; not prone to recurrent UTIs- has had one in the past; admits not drinking enough water at her job recently;  Objective:  Vitals:   11/14/19 1216  BP: 116/72  Pulse: 79  Temp: 98 F (36.7 C)  TempSrc: Oral  SpO2: 98%  Weight: 186 lb 3.2 oz (84.5 kg)  Height: 5\' 3"  (1.6 m)    General: Well developed, well nourished, in no acute distress  Skin : Warm and dry.  Head: Normocephalic and atraumatic  Eyes: Sclera and conjunctiva clear; pupils round and reactive to light; extraocular movements intact  Lungs: Respirations unlabored; clear to auscultation bilaterally without wheeze, rales, rhonchi  Musculoskeletal: No deformities; no active joint inflammation; negative CVA tenderness Extremities: No edema, cyanosis, clubbing  Vessels: Symmetric bilaterally  Neurologic: Alert and oriented; speech intact; face symmetrical; moves  all extremities well; CNII-XII intact without focal deficit   Assessment:  1. Hematuria, unspecified type     Plan:  Will treat for suspected UTI; check urine culture today; Rx for Macrobid 100 mg bid x 7 days; increase fluids, specifically water; work note for today and tomorrow; follow up to be determined based on urine culture results;  This visit occurred during the SARS-CoV-2 public health emergency.  Safety protocols were in place, including screening questions prior to the visit, additional usage of staff PPE, and extensive cleaning of exam room while observing appropriate contact time as indicated for disinfecting solutions.     No follow-ups on file.  Orders Placed This Encounter  Procedures  . Urine Culture    Requested Prescriptions   Signed Prescriptions Disp Refills  . nitrofurantoin, macrocrystal-monohydrate, (MACROBID) 100 MG capsule 14 capsule 0    Sig: Take 1 capsule (100 mg total) by mouth 2 (two) times daily.

## 2019-11-16 LAB — URINE CULTURE

## 2019-11-17 ENCOUNTER — Other Ambulatory Visit: Payer: Federal, State, Local not specified - PPO

## 2019-11-17 ENCOUNTER — Other Ambulatory Visit: Payer: Self-pay | Admitting: Family

## 2019-11-17 DIAGNOSIS — R319 Hematuria, unspecified: Secondary | ICD-10-CM

## 2019-11-22 ENCOUNTER — Ambulatory Visit (INDEPENDENT_AMBULATORY_CARE_PROVIDER_SITE_OTHER)
Admission: RE | Admit: 2019-11-22 | Discharge: 2019-11-22 | Disposition: A | Discharge status: Home / Self Care | Payer: Federal, State, Local not specified - PPO | Source: Ambulatory Visit | Attending: Family | Admitting: Family

## 2019-11-22 ENCOUNTER — Other Ambulatory Visit: Payer: Self-pay | Admitting: Family

## 2019-11-22 ENCOUNTER — Other Ambulatory Visit: Payer: Self-pay

## 2019-11-22 DIAGNOSIS — K529 Noninfective gastroenteritis and colitis, unspecified: Secondary | ICD-10-CM

## 2019-11-22 DIAGNOSIS — R319 Hematuria, unspecified: Secondary | ICD-10-CM | POA: Diagnosis not present

## 2019-12-04 ENCOUNTER — Other Ambulatory Visit: Payer: Self-pay | Admitting: Internal Medicine

## 2019-12-14 ENCOUNTER — Encounter: Payer: Self-pay | Admitting: Family

## 2019-12-21 ENCOUNTER — Encounter: Payer: Self-pay | Admitting: Internal Medicine

## 2019-12-21 ENCOUNTER — Other Ambulatory Visit: Payer: Self-pay

## 2019-12-21 ENCOUNTER — Ambulatory Visit: Payer: Federal, State, Local not specified - PPO | Admitting: Internal Medicine

## 2019-12-21 VITALS — BP 122/84 | HR 83 | Temp 99.6°F | Ht 63.0 in | Wt 184.2 lb

## 2019-12-21 DIAGNOSIS — E349 Endocrine disorder, unspecified: Secondary | ICD-10-CM | POA: Diagnosis not present

## 2019-12-21 DIAGNOSIS — L02415 Cutaneous abscess of right lower limb: Secondary | ICD-10-CM | POA: Diagnosis not present

## 2019-12-21 LAB — CBC
HCT: 44.1 % (ref 36.0–46.0)
Hemoglobin: 14.8 g/dL (ref 12.0–15.0)
MCHC: 33.7 g/dL (ref 30.0–36.0)
MCV: 90.1 fl (ref 78.0–100.0)
Platelets: 273 10*3/uL (ref 150.0–400.0)
RBC: 4.9 Mil/uL (ref 3.87–5.11)
RDW: 13.5 % (ref 11.5–15.5)
WBC: 9.4 10*3/uL (ref 4.0–10.5)

## 2019-12-21 MED ORDER — CHLORHEXIDINE GLUCONATE 4 % EX LIQD: Freq: Every day | CUTANEOUS | 0 refills | Status: DC | PRN

## 2019-12-21 MED ORDER — CEPHALEXIN 500 MG PO CAPS: 500.0000 mg | ORAL_CAPSULE | Freq: Two times a day (BID) | ORAL | 0 refills | Status: DC

## 2019-12-21 NOTE — Patient Instructions (Signed)
We have sent in keflex to take 1 pill twice a day for 5 days. We sent in 7 days worth of medicine so take the last 2 days if this spot is not fully gone.  We have also sent in the body wash to use daily for 3 days in the shower and then rinse off to help get rid of some of the skin bacteria.

## 2019-12-21 NOTE — Progress Notes (Signed)
   Subjective:   Patient ID: Shannon Clay, adult    DOB: April 24, 1996, 24 y.o.   MRN: [id number]  HPI The patient is a 24 YO man coming in for right thigh abscess. Noticed it this morning. Overall it is not changing. Has tried warm cloth without relief. Had a previous one left leg back 4 months ago which had to be drained. This one is smaller than that one.  Review of Systems  Constitutional: Negative.   HENT: Negative.   Eyes: Negative.   Respiratory: Negative for cough, chest tightness and shortness of breath.   Cardiovascular: Negative for chest pain, palpitations and leg swelling.  Gastrointestinal: Negative for abdominal distention, abdominal pain, constipation, diarrhea, nausea and vomiting.  Musculoskeletal: Negative.   Skin: Negative.        Bump right thigh sub-gluteal  Neurological: Negative.   Psychiatric/Behavioral: Negative.     Objective:  Physical Exam Constitutional:      Appearance: He is well-developed.  HENT:     Head: Normocephalic and atraumatic.  Cardiovascular:     Rate and Rhythm: Normal rate and regular rhythm.  Pulmonary:     Effort: Pulmonary effort is normal. No respiratory distress.     Breath sounds: Normal breath sounds. No wheezing or rales.  Abdominal:     General: Bowel sounds are normal. There is no distension.     Palpations: Abdomen is soft.     Tenderness: There is no abdominal tenderness. There is no rebound.  Musculoskeletal:     Cervical back: Normal range of motion.  Skin:    General: Skin is warm and dry.     Comments: Small abscess firm without fluctuance right thigh sub-gluteal without surrounding cellulitis.   Neurological:     Mental Status: He is alert and oriented to person, place, and time.     Coordination: Coordination normal.     Vitals:   12/21/19 1522  BP: 122/84  Pulse: 83  Temp: 99.6 F (37.6 C)  TempSrc: Oral  SpO2: 97%  Weight: 184 lb 3 oz (83.5 kg)  Height: 5\' 3"  (1.6 m)    This visit occurred during  the SARS-CoV-2 public health emergency.  Safety protocols were in place, including screening questions prior to the visit, additional usage of staff PPE, and extensive cleaning of exam room while observing appropriate contact time as indicated for disinfecting solutions.   Assessment & Plan:

## 2019-12-22 DIAGNOSIS — L02415 Cutaneous abscess of right lower limb: Secondary | ICD-10-CM | POA: Insufficient documentation

## 2019-12-22 MED ORDER — TESTOSTERONE CYPIONATE 200 MG/ML IM SOLN: 100.0000 mg | INTRAMUSCULAR | 1 refills | Status: DC

## 2019-12-22 NOTE — Assessment & Plan Note (Signed)
Checking CBC and testosterone levels. Refilled testosterone injections.

## 2019-12-22 NOTE — Assessment & Plan Note (Signed)
Rx keflex 1 week, can stop after 5 days if gone. Rx chlorhexidine rinse to use in shower for several days in a row to prevent recurrence.

## 2019-12-25 ENCOUNTER — Encounter: Payer: Self-pay | Admitting: Family

## 2019-12-26 LAB — CP TESTOSTERONE, BIO-FEMALE/CHILDREN
Albumin: 4.6 g/dL (ref 3.6–5.1)
Sex Hormone Binding: 33 nmol/L (ref 17–124)
TESTOSTERONE, BIOAVAILABLE: 36.1 ng/dL — ABNORMAL HIGH (ref 0.5–8.5)
Testosterone, Free: 17.2 pg/mL — ABNORMAL HIGH (ref 0.2–5.0)
Testosterone, Total, LC-MS-MS: 143 ng/dL — ABNORMAL HIGH (ref 2–45)

## 2020-01-10 ENCOUNTER — Encounter: Payer: Self-pay | Admitting: Internal Medicine

## 2020-01-18 ENCOUNTER — Other Ambulatory Visit: Payer: Self-pay | Admitting: *Deleted

## 2020-01-18 NOTE — Telephone Encounter (Signed)
Pt last seen 10-24-18, was not sure if wanted to refill until seen.  I pended order anyway for you.

## 2020-01-22 MED ORDER — MODAFINIL 200 MG PO TABS: 200.0000 mg | ORAL_TABLET | Freq: Every day | ORAL | 5 refills | Status: DC

## 2020-01-24 ENCOUNTER — Telehealth: Payer: Self-pay | Admitting: *Deleted

## 2020-01-24 NOTE — Telephone Encounter (Signed)
Modafinil PA, key: [id number] Authorization is valid from 12/25/2019 through 01/23/2021

## 2020-01-25 NOTE — Telephone Encounter (Signed)
BCBS approved Moafinil 12/25/19 through 01/23/2021.

## 2020-02-06 ENCOUNTER — Encounter: Payer: Self-pay | Admitting: Adult Health

## 2020-02-06 ENCOUNTER — Ambulatory Visit: Payer: Self-pay | Admitting: Adult Health

## 2020-05-02 ENCOUNTER — Ambulatory Visit (INDEPENDENT_AMBULATORY_CARE_PROVIDER_SITE_OTHER): Payer: Federal, State, Local not specified - PPO

## 2020-05-02 ENCOUNTER — Encounter: Payer: Self-pay | Admitting: Internal Medicine

## 2020-05-02 ENCOUNTER — Ambulatory Visit: Payer: Federal, State, Local not specified - PPO | Admitting: Internal Medicine

## 2020-05-02 ENCOUNTER — Other Ambulatory Visit: Payer: Self-pay

## 2020-05-02 VITALS — BP 118/90 | HR 72 | Temp 99.0°F | Ht 63.0 in | Wt 182.0 lb

## 2020-05-02 DIAGNOSIS — J4520 Mild intermittent asthma, uncomplicated: Secondary | ICD-10-CM | POA: Diagnosis not present

## 2020-05-02 DIAGNOSIS — M7989 Other specified soft tissue disorders: Secondary | ICD-10-CM | POA: Diagnosis not present

## 2020-05-02 DIAGNOSIS — M25572 Pain in left ankle and joints of left foot: Secondary | ICD-10-CM

## 2020-05-02 DIAGNOSIS — F411 Generalized anxiety disorder: Secondary | ICD-10-CM

## 2020-05-02 MED ORDER — TRAMADOL HCL 50 MG PO TABS: 50.0000 mg | ORAL_TABLET | Freq: Four times a day (QID) | ORAL | 0 refills | Status: DC | PRN

## 2020-05-02 NOTE — Patient Instructions (Addendum)
You appear to have at least a severe left lateral ankle sprain (or maybe even a fracture)  We have placed the ACE wrap for support, and we are hoping they are able to give you a soft soled shoe at the first floor   Please take all new medication as prescribed - the tramadol for pain  OK for icing for 15 min for three times per day, and leg elevation to help with the swelling  Please let us know if you need a work note to be off the ankle for at least a few days  You may need crutches and orthopedic referral if you have a fracture on the xray  Please continue all other medications as before, and refills have been done if requested.  Please go to the XRAY Department in the first floor for the x-ray testing  You will be contacted by phone if any changes need to be made immediately.  Otherwise, you will receive a letter about your results with an explanation, but please check with MyChart first.  Please remember to sign up for MyChart if you have not done so, as this will be important to you in the future with finding out test results, communicating by private email, and scheduling acute appointments online when needed.

## 2020-05-02 NOTE — Progress Notes (Addendum)
Subjective:    Patient ID: Shannon Clay, adult    DOB: 1996/06/08, 24 y.o.   MRN: [id number]  HPI  Here to f/u; overall doing ok,  Pt denies chest pain, increasing sob or doe, wheezing, orthopnea, PND, increased LE swelling, palpitations, dizziness or syncope.  Pt denies new neurological symptoms such as new headache, or facial or extremity weakness or numbness.  Pt denies polydipsia, polyuria, or low sugar episode.  Pt states overall good compliance with meds, but did have a mistep with left foot inversion at the ankle now with severe pain and swelling, but no bruising noted this am Past Medical History:  Diagnosis Date   ADHD (attention deficit hyperactivity disorder)    Asthma    Depression    Frequent headaches    Migraine    History reviewed. No pertinent surgical history.  reports that he has never smoked. He has never used smokeless tobacco. He reports that he does not drink alcohol and does not use drugs. family history includes ADD / ADHD in his mother; Anxiety disorder in his mother and sister; Arthritis in his maternal grandmother; Depression in his paternal aunt and sister; Mental illness in his mother; OCD in his father. Allergies  Allergen Reactions   Latex Swelling    Swelling    Sulfa Antibiotics    Current Outpatient Medications on File Prior to Visit  Medication Sig Dispense Refill   buPROPion (WELLBUTRIN XL) 150 MG 24 hr tablet      cephALEXin (KEFLEX) 500 MG capsule Take 1 capsule (500 mg total) by mouth 2 (two) times daily. 14 capsule 0   chlorhexidine (HIBICLENS) 4 % external liquid Apply topically daily as needed. 120 mL 0   citalopram (CELEXA) 40 MG tablet      Diclofenac Potassium (CAMBIA) 50 MG PACK Take 50 mg at the onset of migraine. 1 each 5   gabapentin (NEURONTIN) 100 MG capsule TAKE 1-2 CAPSULES (100-200 MG TOTAL) BY MOUTH AT BEDTIME. 60 capsule 3   HYDROcodone-acetaminophen (NORCO/VICODIN) 5-325 MG tablet Take 1-2 tablets by mouth  every 6 (six) hours as needed for moderate pain. 30 tablet 0   modafinil (PROVIGIL) 200 MG tablet Take 1 tablet (200 mg total) by mouth daily. 30 tablet 5   nitrofurantoin, macrocrystal-monohydrate, (MACROBID) 100 MG capsule Take 1 capsule (100 mg total) by mouth 2 (two) times daily. 14 capsule 0   ondansetron (ZOFRAN) 4 MG tablet Take 1 tablet (4 mg total) by mouth every 8 (eight) hours as needed for nausea or vomiting. 20 tablet 0   pantoprazole (PROTONIX) 40 MG tablet Take 1 tablet (40 mg total) by mouth daily. 90 tablet 3   predniSONE (DELTASONE) 50 MG tablet Take 1 tablet (50 mg total) by mouth daily with breakfast. 5 tablet 0   testosterone cypionate (DEPOTESTOSTERONE CYPIONATE) 200 MG/ML injection Inject 0.5 mLs (100 mg total) into the muscle every 14 (fourteen) days. 6 mL 1   No current facility-administered medications on file prior to visit.   Review of Systems All otherwise neg per pt    Objective:   Physical Exam BP (!) 118/90 (BP Location: Left Arm, Patient Position: Sitting, Cuff Size: Large)    Pulse 72    Temp 99 F (37.2 C) (Oral)    Ht 5\' 3"  (1.6 m)    Wt 182 lb (82.6 kg)    SpO2 98%    BMI 32.24 kg/m  VS noted,  Constitutional: Pt appears in NAD HENT: Head: NCAT.  Right  Ear: External ear normal.  Left Ear: External ear normal.  Eyes: . Pupils are equal, round, and reactive to light. Conjunctivae and EOM are normal Nose: without d/c or deformity Neck: Neck supple. Gross normal ROM Cardiovascular: Normal rate and regular rhythm.   Pulmonary/Chest: Effort normal and breath sounds without rales or wheezing.  Left ankle with 2+ tender swelilng Neurological: Pt is alert. At baseline orientation, motor grossly intact Skin: Skin is warm. No rashes, other new lesions, no LE edema Psychiatric: Pt behavior is normal without agitation , mild nervous All otherwise neg per pt Lab Results  Component Value Date   WBC 9.4 12/21/2019   HGB 14.8 12/21/2019   HCT 44.1  12/21/2019   PLT 273.0 12/21/2019   GLUCOSE 100 (H) 05/04/2019   CHOL 161 02/18/2017   TRIG 75.0 02/18/2017   HDL 49.60 02/18/2017   LDLCALC 97 02/18/2017   ALT 34 05/04/2019   AST 20 05/04/2019   NA 139 05/04/2019   K 4.3 05/04/2019   CL 105 05/04/2019   CREATININE 0.81 05/04/2019   BUN 13 05/04/2019   CO2 28 05/04/2019   TSH 2.21 02/18/2017      Assessment & Plan:

## 2020-05-03 ENCOUNTER — Ambulatory Visit: Payer: Federal, State, Local not specified - PPO | Admitting: Family

## 2020-05-04 ENCOUNTER — Encounter: Payer: Self-pay | Admitting: Internal Medicine

## 2020-05-04 NOTE — Assessment & Plan Note (Signed)
midl situaitonal worsening, reassured

## 2020-05-04 NOTE — Assessment & Plan Note (Addendum)
C/w sprain cant r/o fx, for ace wrap, icing, elevation, tramadol prn, xray and f/u sport med if not improved  I spent 31 minutes in preparing to see the patient by review of recent labs, imaging and procedures, obtaining and reviewing separately obtained history, communicating with the patient and family or caregiver, ordering medications, tests or procedures, and documenting clinical information in the EHR including the differential Dx, treatment, and any further evaluation and other management of left ankle pain, asthma, anxiety

## 2020-05-04 NOTE — Assessment & Plan Note (Signed)
stable overall by history and exam, recent data reviewed with pt, and pt to continue medical treatment as before,  to f/u any worsening symptoms or concerns  

## 2020-05-07 ENCOUNTER — Telehealth: Payer: Self-pay

## 2020-05-07 NOTE — Telephone Encounter (Signed)
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 And that we would have to bill his brace through his regular insurance. He will then have to file a claim to move the payment to workers comp.

## 2020-06-06 ENCOUNTER — Encounter: Payer: Self-pay | Admitting: Internal Medicine

## 2020-08-27 ENCOUNTER — Other Ambulatory Visit: Payer: Self-pay | Admitting: Adult Health

## 2020-08-28 ENCOUNTER — Telehealth (INDEPENDENT_AMBULATORY_CARE_PROVIDER_SITE_OTHER): Payer: Federal, State, Local not specified - PPO | Admitting: Adult Health

## 2020-08-28 DIAGNOSIS — G47411 Narcolepsy with cataplexy: Secondary | ICD-10-CM | POA: Diagnosis not present

## 2020-08-28 DIAGNOSIS — G43109 Migraine with aura, not intractable, without status migrainosus: Secondary | ICD-10-CM | POA: Diagnosis not present

## 2020-08-28 MED ORDER — MODAFINIL 200 MG PO TABS: 200.0000 mg | ORAL_TABLET | Freq: Every day | ORAL | 5 refills | Status: DC

## 2020-08-28 NOTE — Progress Notes (Signed)
PATIENT: Shannon Clay DOB: [birth year]  REASON FOR VISIT: follow up HISTORY FROM: patient  Virtual Visit via Video Note  I connected with Shannon Clay on 08/28/20 at  3:00 PM EST by a video enabled telemedicine application located remotely at [facility] and verified that I am speaking with the correct person using two identifiers who was located at their own home.   I discussed the limitations of evaluation and management by telemedicine and the availability of in person appointments. The patient expressed understanding and agreed to proceed.   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  He returns today for follow-up.  He reports that Provigil is working well.  He denies any daytime sleepiness.  Able to work without falling asleep.  At the last visit he was given a prescription of Cambia but reports that he is not had to use it as he has not had any severe headaches.  He returns today for a virtual visit  HISTORY 10/24/18: Shannon Clay is a 24 year old female with a history of narcolepsy.  She returns today for follow-up.  She reports that Provigil works well for her.  She does report that at times she gets sleepy but she is able to manage.  She states that she is also been having migraine headaches.  This started when she was in her teens.  Her primary care ordered a CT of the head that was unremarkable.  She states that she has approximately one severe migraine a month.  But she may have a headache every 2 weeks.  She states when she does get a headache it occurs all over the head.  She does have photophobia and phonophobia as well as nausea and vomiting.  She states that typically her headaches start with a flashing light in her vision.  She states that with her headaches she can sometimes have a loss of vision and  numbness on one side of the body.  She states when this happens she is unable to work.  She reports that in her teens she was on medication (not daily) but is unsure what the medication was.  She returns today for follow-up.  REVIEW OF SYSTEMS: Out of a complete 14 system review of symptoms, the patient complains only of the following symptoms, and all other reviewed systems are negative.  See HPI  ALLERGIES: Allergies  Allergen Reactions  . Latex Swelling    Swelling   . Sulfa Antibiotics     HOME MEDICATIONS: Outpatient Medications Prior to Visit  Medication Sig Dispense Refill  . buPROPion (WELLBUTRIN XL) 150 MG 24 hr tablet     . cephALEXin (KEFLEX) 500 MG capsule Take 1 capsule (500 mg total) by mouth 2 (two) times daily. 14 capsule 0  . chlorhexidine (HIBICLENS) 4 % external liquid Apply topically daily as needed. 120 mL 0  . citalopram (CELEXA) 40 MG tablet     . Diclofenac Potassium (CAMBIA) 50 MG PACK Take 50 mg at the onset of migraine. 1 each 5  . gabapentin (NEURONTIN) 100 MG capsule TAKE 1-2 CAPSULES (100-200 MG TOTAL) BY MOUTH AT BEDTIME. 60 capsule 3  . HYDROcodone-acetaminophen (NORCO/VICODIN) 5-325 MG tablet Take 1-2 tablets by mouth every 6 (six) hours as needed for moderate pain. 30 tablet 0  . modafinil (PROVIGIL) 200 MG tablet Take 1  tablet (200 mg total) by mouth daily. 30 tablet 5  . nitrofurantoin, macrocrystal-monohydrate, (MACROBID) 100 MG capsule Take 1 capsule (100 mg total) by mouth 2 (two) times daily. 14 capsule 0  . ondansetron (ZOFRAN) 4 MG tablet Take 1 tablet (4 mg total) by mouth every 8 (eight) hours as needed for nausea or vomiting. 20 tablet 0  . pantoprazole (PROTONIX) 40 MG tablet Take 1 tablet (40 mg total) by mouth daily. 90 tablet 3  . predniSONE (DELTASONE) 50 MG tablet Take 1 tablet (50 mg total) by mouth daily with breakfast. 5 tablet 0  . testosterone cypionate (DEPOTESTOSTERONE CYPIONATE) 200 MG/ML injection Inject 0.5 mLs (100 mg total)  into the muscle every 14 (fourteen) days. 6 mL 1  . traMADol (ULTRAM) 50 MG tablet Take 1 tablet (50 mg total) by mouth every 6 (six) hours as needed. 30 tablet 0   No facility-administered medications prior to visit.    PAST MEDICAL HISTORY: Past Medical History:  Diagnosis Date  . ADHD (attention deficit hyperactivity disorder)   . Asthma   . Depression   . Frequent headaches   . Migraine     PAST SURGICAL HISTORY: No past surgical history on file.  FAMILY HISTORY: Family History  Problem Relation Age of Onset  . ADD / ADHD Mother   . Anxiety disorder Mother   . Mental illness Mother   . OCD Father   . Anxiety disorder Sister   . Depression Sister   . Depression Paternal Aunt   . Arthritis Maternal Grandmother     SOCIAL HISTORY: Social History   Socioeconomic History  . Marital status: Single    Spouse name: Not on file  . Number of children: Not on file  . Years of education: Not on file  . Highest education level: Not on file  Occupational History  . Not on file  Tobacco Use  . Smoking status: Never Smoker  . Smokeless tobacco: Never Used  Vaping Use  . Vaping Use: Some days  Substance and Sexual Activity  . Alcohol use: No    Comment: social  . Drug use: No  . Sexual activity: Never  Other Topics Concern  . Not on file  Social History Narrative  . Not on file   Social Determinants of Health   Financial Resource Strain:   . Difficulty of Paying Living Expenses: Not on file  Food Insecurity:   . Worried About Charity fundraiser in the Last Year: Not on file  . Ran Out of Food in the Last Year: Not on file  Transportation Needs:   . Lack of Transportation (Medical): Not on file  . Lack of Transportation (Non-Medical): Not on file  Physical Activity:   . Days of Exercise per Week: Not on file  . Minutes of Exercise per Session: Not on file  Stress:   . Feeling of Stress : Not on file  Social Connections:   . Frequency of Communication with  Friends and Family: Not on file  . Frequency of Social Gatherings with Friends and Family: Not on file  . Attends Religious Services: Not on file  . Active Member of Clubs or Organizations: Not on file  . Attends Archivist Meetings: Not on file  . Marital Status: Not on file  Intimate Partner Violence:   . Fear of Current or Ex-Partner: Not on file  . Emotionally Abused: Not on file  . Physically Abused: Not on file  . Sexually Abused:  Not on file      PHYSICAL EXAM Generalized: Well developed, in no acute distress   Neurological examination  Mentation: Alert oriented to time, place, history taking. Follows all commands speech and language fluent Cranial nerve II-XII:Extraocular movements were full. Facial symmetry noted. uvula tongue midline. Head turning and shoulder shrug  were normal and symmetric. Motor: Good strength throughout subjectively per patient Sensory: Sensory testing is intact to soft touch on all 4 extremities subjectively per patient Coordination: Cerebellar testing reveals good finger-nose-finger  Gait and station: Patient is able to stand from a seated position. gait is normal.  Reflexes: UTA  DIAGNOSTIC DATA (LABS, IMAGING, TESTING) - I reviewed patient records, labs, notes, testing and imaging myself where available.  Lab Results  Component Value Date   WBC 9.4 12/21/2019   HGB 14.8 12/21/2019   HCT 44.1 12/21/2019   MCV 90.1 12/21/2019   PLT 273.0 12/21/2019      Component Value Date/Time   NA 139 05/04/2019 1523   NA 143 11/25/2017 0921   K 4.3 05/04/2019 1523   CL 105 05/04/2019 1523   CO2 28 05/04/2019 1523   GLUCOSE 100 (H) 05/04/2019 1523   BUN 13 05/04/2019 1523   BUN 13 11/25/2017 0921   CREATININE 0.81 05/04/2019 1523   CALCIUM 9.5 05/04/2019 1523   PROT 6.8 05/04/2019 1523   PROT 6.3 11/25/2017 0921   ALBUMIN 4.5 05/04/2019 1523   ALBUMIN 4.5 11/25/2017 0921   AST 20 05/04/2019 1523   ALT 34 05/04/2019 1523   ALKPHOS  60 05/04/2019 1523   BILITOT 0.4 05/04/2019 1523   BILITOT 0.5 11/25/2017 0921   GFRNONAA 108 11/25/2017 0921   GFRAA 125 11/25/2017 0921   Lab Results  Component Value Date   CHOL 161 02/18/2017   HDL 49.60 02/18/2017   LDLCALC 97 02/18/2017   TRIG 75.0 02/18/2017   CHOLHDL 3 02/18/2017   No results found for: HGBA1C No results found for: VITAMINB12 Lab Results  Component Value Date   TSH 2.21 02/18/2017      ASSESSMENT AND PLAN 24 y.o. year old adult  has a past medical history of ADHD (attention deficit hyperactivity disorder), Asthma, Depression, Frequent headaches, and Migraine. here with:  1.   Narcolepsy  -Continue Provigil 200 mg daily  2.  Migraine headaches  -Stable -Advised that if his headache frequency increases he should let us know  He will follow-up in 6 months or sooner if needed  I spent 25 minutes of face-to-face and non-face-to-face time with patient.  This included previsit chart review, lab review, study review, order entry, electronic health record documentation, patient education.    Ward Givens, MSN, NP-C 08/28/2020, 2:50 PM [facility] [address], [place], [place] [address] [phone]

## 2020-09-11 ENCOUNTER — Encounter: Payer: Self-pay | Admitting: Physical Therapy

## 2020-09-23 ENCOUNTER — Encounter: Payer: Self-pay | Admitting: Internal Medicine

## 2020-09-23 ENCOUNTER — Other Ambulatory Visit: Payer: Self-pay

## 2020-09-23 ENCOUNTER — Ambulatory Visit: Payer: Federal, State, Local not specified - PPO | Admitting: Internal Medicine

## 2020-09-23 VITALS — BP 116/72 | HR 74 | Temp 98.8°F | Ht 63.0 in | Wt 190.2 lb

## 2020-09-23 DIAGNOSIS — Z23 Encounter for immunization: Secondary | ICD-10-CM | POA: Diagnosis not present

## 2020-09-23 DIAGNOSIS — Z0000 Encounter for general adult medical examination without abnormal findings: Secondary | ICD-10-CM | POA: Diagnosis not present

## 2020-09-23 DIAGNOSIS — E349 Endocrine disorder, unspecified: Secondary | ICD-10-CM | POA: Diagnosis not present

## 2020-09-23 LAB — CBC
HCT: 45 % (ref 36.0–46.0)
Hemoglobin: 14.8 g/dL (ref 12.0–15.0)
MCHC: 32.9 g/dL (ref 30.0–36.0)
MCV: 88.8 fl (ref 78.0–100.0)
Platelets: 282 10*3/uL (ref 150.0–400.0)
RBC: 5.07 Mil/uL (ref 3.87–5.11)
RDW: 13 % (ref 11.5–15.5)
WBC: 9.8 10*3/uL (ref 4.0–10.5)

## 2020-09-23 LAB — COMPREHENSIVE METABOLIC PANEL
ALT: 27 U/L (ref 0–35)
AST: 17 U/L (ref 0–37)
Albumin: 4.6 g/dL (ref 3.5–5.2)
Alkaline Phosphatase: 57 U/L (ref 39–117)
BUN: 16 mg/dL (ref 6–23)
CO2: 30 mEq/L (ref 19–32)
Calcium: 9.6 mg/dL (ref 8.4–10.5)
Chloride: 102 mEq/L (ref 96–112)
Creatinine, Ser: 0.84 mg/dL (ref 0.40–1.20)
GFR: 96.6 mL/min (ref >60.00)
Glucose, Bld: 92 mg/dL (ref 70–99)
Potassium: 4.2 mEq/L (ref 3.5–5.1)
Sodium: 138 mEq/L (ref 135–145)
Total Bilirubin: 0.3 mg/dL (ref 0.2–1.2)
Total Protein: 7.4 g/dL (ref 6.0–8.3)

## 2020-09-23 LAB — LIPID PANEL
Cholesterol: 223 mg/dL — ABNORMAL HIGH (ref 0–200)
HDL: 41.8 mg/dL (ref >39.00)
NonHDL: 181.49
Total CHOL/HDL Ratio: 5
Triglycerides: 241 mg/dL — ABNORMAL HIGH (ref 0.0–149.0)
VLDL: 48.2 mg/dL — ABNORMAL HIGH (ref 0.0–40.0)

## 2020-09-23 LAB — LDL CHOLESTEROL, DIRECT: Direct LDL: 145 mg/dL

## 2020-09-23 MED ORDER — CEPHALEXIN 500 MG PO CAPS
500.0000 mg | ORAL_CAPSULE | Freq: Two times a day (BID) | ORAL | 0 refills | Status: AC
Start: 2020-09-23 — End: 2020-09-28

## 2020-09-23 MED ORDER — TESTOSTERONE 20.25 MG/ACT (1.62%) TD GEL: 1.0000 | Freq: Every day | TRANSDERMAL | 1 refills | Status: DC

## 2020-09-23 MED ORDER — TESTOSTERONE CYPIONATE 200 MG/ML IM SOLN: 100.0000 mg | INTRAMUSCULAR | 1 refills | Status: DC

## 2020-09-23 NOTE — Progress Notes (Signed)
° °  Subjective:   Patient ID: Shannon Clay, adult    DOB: [birth year], 24 y.o.   MRN: [id number]  HPI  The patient is a 24 YO coming in for physical.   PMH, [place], social history reviewed and updated  Review of Systems  Constitutional: Negative.   HENT: Negative.   Eyes: Negative.   Respiratory: Negative for cough, chest tightness and shortness of breath.   Cardiovascular: Negative for chest pain, palpitations and leg swelling.  Gastrointestinal: Negative for abdominal distention, abdominal pain, constipation, diarrhea, nausea and vomiting.  Musculoskeletal: Negative.   Skin: Negative.   Neurological: Negative.   Psychiatric/Behavioral: Negative.     Objective:  Physical Exam Constitutional:      Appearance: He is well-developed and well-nourished.  HENT:     Head: Normocephalic and atraumatic.  Eyes:     Extraocular Movements: EOM normal.  Cardiovascular:     Rate and Rhythm: Normal rate and regular rhythm.  Pulmonary:     Effort: Pulmonary effort is normal. No respiratory distress.     Breath sounds: Normal breath sounds. No wheezing or rales.  Abdominal:     General: Bowel sounds are normal. There is no distension.     Palpations: Abdomen is soft.     Tenderness: There is no abdominal tenderness. There is no rebound.  Musculoskeletal:        General: No edema.     Cervical back: Normal range of motion.  Skin:    General: Skin is warm and dry.  Neurological:     Mental Status: He is alert and oriented to person, place, and time.     Coordination: Coordination normal.  Psychiatric:        Mood and Affect: Mood and affect normal.     Vitals:   09/23/20 1307  BP: 116/72  Pulse: 74  Temp: 98.8 F (37.1 C)  TempSrc: Oral  SpO2: 98%  Weight: 190 lb 3.2 oz (86.3 kg)  Height: 5\' 3"  (1.6 m)    This visit occurred during the SARS-CoV-2 public health emergency.  Safety protocols were in place, including screening questions prior to the visit, additional usage of  staff PPE, and extensive cleaning of exam room while observing appropriate contact time as indicated for disinfecting solutions.   Assessment & Plan:  Flu shot given at visit

## 2020-09-23 NOTE — Patient Instructions (Addendum)
We have sent in keflex to take 1 pill twice a day for 5 days for the spot on the leg.   Health Maintenance, Female Adopting a healthy lifestyle and getting preventive care are important in promoting health and wellness. Ask your health care provider about:  The right schedule for you to have regular tests and exams.  Things you can do on your own to prevent diseases and keep yourself healthy. What should I know about diet, weight, and exercise? Eat a healthy diet   Eat a diet that includes plenty of vegetables, fruits, low-fat dairy products, and lean protein.  Do not eat a lot of foods that are high in solid fats, added sugars, or sodium. Maintain a healthy weight Body mass index (BMI) is used to identify weight problems. It estimates body fat based on height and weight. Your health care provider can help determine your BMI and help you achieve or maintain a healthy weight. Get regular exercise Get regular exercise. This is one of the most important things you can do for your health. Most adults should:  Exercise for at least 150 minutes each week. The exercise should increase your heart rate and make you sweat (moderate-intensity exercise).  Do strengthening exercises at least twice a week. This is in addition to the moderate-intensity exercise.  Spend less time sitting. Even light physical activity can be beneficial. Watch cholesterol and blood lipids Have your blood tested for lipids and cholesterol at 25 years of age, then have this test every 5 years. Have your cholesterol levels checked more often if:  Your lipid or cholesterol levels are high.  You are older than 25 years of age.  You are at high risk for heart disease. What should I know about cancer screening? Depending on your health history and family history, you may need to have cancer screening at various ages. This may include screening for:  Breast cancer.  Cervical cancer.  Colorectal cancer.  Skin  cancer.  Lung cancer. What should I know about heart disease, diabetes, and high blood pressure? Blood pressure and heart disease  High blood pressure causes heart disease and increases the risk of stroke. This is more likely to develop in people who have high blood pressure readings, are of African descent, or are overweight.  Have your blood pressure checked: ? Every 3-5 years if you are 51-20 years of age. ? Every year if you are 19 years old or older. Diabetes Have regular diabetes screenings. This checks your fasting blood sugar level. Have the screening done:  Once every three years after age 56 if you are at a normal weight and have a low risk for diabetes.  More often and at a younger age if you are overweight or have a high risk for diabetes. What should I know about preventing infection? Hepatitis B If you have a higher risk for hepatitis B, you should be screened for this virus. Talk with your health care provider to find out if you are at risk for hepatitis B infection. Hepatitis C Testing is recommended for:  Everyone born from [birth year] through 1966.  Anyone with known risk factors for hepatitis C. Sexually transmitted infections (STIs)  Get screened for STIs, including gonorrhea and chlamydia, if: ? You are sexually active and are younger than 25 years of age. ? You are older than 25 years of age and your health care provider tells you that you are at risk for this type of infection. ? Your sexual activity  has changed since you were last screened, and you are at increased risk for chlamydia or gonorrhea. Ask your health care provider if you are at risk.  Ask your health care provider about whether you are at high risk for HIV. Your health care provider may recommend a prescription medicine to help prevent HIV infection. If you choose to take medicine to prevent HIV, you should first get tested for HIV. You should then be tested every 3 months for as long as you are taking  the medicine. Pregnancy  If you are about to stop having your period (premenopausal) and you may become pregnant, seek counseling before you get pregnant.  Take 400 to 800 micrograms (mcg) of folic acid every day if you become pregnant.  Ask for birth control (contraception) if you want to prevent pregnancy. Osteoporosis and menopause Osteoporosis is a disease in which the bones lose minerals and strength with aging. This can result in bone fractures. If you are 33 years old or older, or if you are at risk for osteoporosis and fractures, ask your health care provider if you should:  Be screened for bone loss.  Take a calcium or vitamin D supplement to lower your risk of fractures.  Be given hormone replacement therapy (HRT) to treat symptoms of menopause. Follow these instructions at home: Lifestyle  Do not use any products that contain nicotine or tobacco, such as cigarettes, e-cigarettes, and chewing tobacco. If you need help quitting, ask your health care provider.  Do not use street drugs.  Do not share needles.  Ask your health care provider for help if you need support or information about quitting drugs. Alcohol use  Do not drink alcohol if: ? Your health care provider tells you not to drink. ? You are pregnant, may be pregnant, or are planning to become pregnant.  If you drink alcohol: ? Limit how much you use to 0-1 drink a day. ? Limit intake if you are breastfeeding.  Be aware of how much alcohol is in your drink. In the [place] one drink equals one 12 oz bottle of beer (355 mL), one 5 oz glass of wine (148 mL), or one 1 oz glass of hard liquor (44 mL). General instructions  Schedule regular health, dental, and eye exams.  Stay current with your vaccines.  Tell your health care provider if: ? You often feel depressed. ? You have ever been abused or do not feel safe at home. Summary  Adopting a healthy lifestyle and getting preventive care are important in  promoting health and wellness.  Follow your health care provider's instructions about healthy diet, exercising, and getting tested or screened for diseases.  Follow your health care provider's instructions on monitoring your cholesterol and blood pressure. This information is not intended to replace advice given to you by your health care provider. Make sure you discuss any questions you have with your health care provider. Document Revised: 09/21/2018 Document Reviewed: 09/21/2018 Elsevier Patient Education  2020 [employer].

## 2020-09-23 NOTE — Assessment & Plan Note (Signed)
Flu shot given. Tetanus up to date. Counseled about appropriate screenings. Counseled about sun safety and mole surveillance. Counseled about the dangers of distracted driving. Given 10 year screening recommendations.

## 2020-09-23 NOTE — Assessment & Plan Note (Signed)
Checking testosterone levels. Rx androgel as this is more convenient to see if it can be affordable. Refill testosterone vials in case not affordable.

## 2020-09-25 ENCOUNTER — Encounter: Payer: Self-pay | Admitting: Physical Therapy

## 2020-09-27 LAB — CP TESTOSTERONE, BIO-FEMALE/CHILDREN
Albumin: 5 g/dL (ref 3.6–5.1)
Sex Hormone Binding: 32.7 nmol/L (ref 17–124)
TESTOSTERONE, BIOAVAILABLE: 15 ng/dL — ABNORMAL HIGH (ref 0.5–8.5)
Testosterone, Free: 6.6 pg/mL — ABNORMAL HIGH (ref 0.2–5.0)
Testosterone, Total, LC-MS-MS: 57 ng/dL — ABNORMAL HIGH (ref 2–45)

## 2020-10-07 ENCOUNTER — Telehealth: Payer: Self-pay

## 2020-10-07 ENCOUNTER — Other Ambulatory Visit: Payer: Self-pay

## 2020-10-07 NOTE — Telephone Encounter (Signed)
Key: [id number]

## 2020-10-08 NOTE — Telephone Encounter (Signed)
PA Denied for the following reason: "the provider's specialty does not establish medical necessity for this drug. Medical necessity is determined by adherence to generally accepted standards of medical practice in the [place], is clinically appropriate, in terms of type, frequency, extent, site, duration and considered effective for the patient's illness, injury, disease, or its symptoms."

## 2020-10-09 NOTE — Telephone Encounter (Signed)
Pt notified that insurance will not cover the testosterone gel. Per Dr Okey Dupre can continue with injections or pay out of pocket. Verified prescribed injection dose still the same & pt has no further ques/concerns at this time.

## 2020-10-09 NOTE — Telephone Encounter (Signed)
The PA was denied b/c PCP did not establish medical necessity for the testosterone gel.  do you want to offer an alternative to the patient, document medical necessity, appeal, or do nothing? The medication was listed in the visit info.

## 2020-10-09 NOTE — Telephone Encounter (Signed)
No, this is not likely to be covered and this was discussed with patient at visit. Can buy out of pocket or continue with injections.

## 2020-10-09 NOTE — Telephone Encounter (Signed)
I don't even see a question or medication in this message so am not sure what to address.

## 2021-02-11 ENCOUNTER — Telehealth: Payer: Self-pay | Admitting: Neurology

## 2021-02-11 NOTE — Telephone Encounter (Signed)
PA submitted through [facility] [id number] Approved immediately 01/12/2021-02/11/2022

## 2021-02-24 ENCOUNTER — Telehealth: Payer: Federal, State, Local not specified - PPO | Admitting: Adult Health

## 2021-03-24 ENCOUNTER — Other Ambulatory Visit: Payer: Self-pay | Admitting: Internal Medicine

## 2021-04-21 ENCOUNTER — Ambulatory Visit: Payer: Federal, State, Local not specified - PPO | Admitting: Internal Medicine

## 2021-04-21 ENCOUNTER — Other Ambulatory Visit: Payer: Self-pay

## 2021-04-21 ENCOUNTER — Encounter: Payer: Self-pay | Admitting: Internal Medicine

## 2021-04-21 DIAGNOSIS — F332 Major depressive disorder, recurrent severe without psychotic features: Secondary | ICD-10-CM | POA: Diagnosis not present

## 2021-04-21 DIAGNOSIS — E349 Endocrine disorder, unspecified: Secondary | ICD-10-CM | POA: Diagnosis not present

## 2021-04-21 MED ORDER — CITALOPRAM HYDROBROMIDE 40 MG PO TABS: 40.0000 mg | ORAL_TABLET | Freq: Every day | ORAL | 3 refills | Status: DC

## 2021-04-21 NOTE — Assessment & Plan Note (Signed)
Good control with celexa 40 mg daily and needs refill. Rx done today.

## 2021-04-21 NOTE — Patient Instructions (Signed)
You can decide to increase the testosterone to 150 mg every 2 weeks (0.75 mL).

## 2021-04-21 NOTE — Progress Notes (Signed)
   Subjective:   Patient ID: Shannon Clay, adult    DOB: December 21, 1995, 25 y.o.   MRN: [id number]  HPI The patient is a 25 YO and coming in for follow up. Last testosterone levels lower and did continue taking 100 mg IM every 2 weeks currently. Feels levels are good no problems or concerns about this. Needs refill of celexa.   Review of Systems  Constitutional: Negative.   HENT: Negative.    Eyes: Negative.   Respiratory:  Negative for cough, chest tightness and shortness of breath.   Cardiovascular:  Negative for chest pain, palpitations and leg swelling.  Gastrointestinal:  Negative for abdominal distention, abdominal pain, constipation, diarrhea, nausea and vomiting.  Musculoskeletal: Negative.   Skin: Negative.   Neurological: Negative.   Psychiatric/Behavioral: Negative.     Objective:  Physical Exam Constitutional:      Appearance: He is well-developed.  HENT:     Head: Normocephalic and atraumatic.  Cardiovascular:     Rate and Rhythm: Normal rate and regular rhythm.  Pulmonary:     Effort: Pulmonary effort is normal. No respiratory distress.     Breath sounds: Normal breath sounds. No wheezing or rales.  Abdominal:     General: Bowel sounds are normal. There is no distension.     Palpations: Abdomen is soft.     Tenderness: There is no abdominal tenderness. There is no rebound.  Musculoskeletal:     Cervical back: Normal range of motion.  Skin:    General: Skin is warm and dry.  Neurological:     Mental Status: He is alert and oriented to person, place, and time.     Coordination: Coordination normal.    Vitals:   04/21/21 1255  BP: 118/74  Pulse: 91  Resp: 18  Temp: 98.6 F (37 C)  TempSrc: Oral  SpO2: 97%  Weight: 187 lb 3.2 oz (84.9 kg)  Height: 5\' 3"  (1.6 m)   This visit occurred during the SARS-CoV-2 public health emergency.  Safety protocols were in place, including screening questions prior to the visit, additional usage of staff PPE, and extensive  cleaning of exam room while observing appropriate contact time as indicated for disinfecting solutions.   Assessment & Plan:

## 2021-04-21 NOTE — Assessment & Plan Note (Signed)
Taking testosterone 100 mg every 2 weeks and satisfied with management. No levels today.

## 2021-04-23 ENCOUNTER — Other Ambulatory Visit: Payer: Self-pay | Admitting: Adult Health

## 2021-04-23 NOTE — Telephone Encounter (Signed)
Patient last seen November 2021.  Pending appointment for December 2022.  Per  registry last filled on 02/11/2021 #30/30. Refill requests sent to MM NP.

## 2021-04-23 NOTE — Telephone Encounter (Signed)
Pt requesting refill for modafinil (PROVIGIL) 200 MG tablet. [facility]acy [phone].

## 2021-04-24 MED ORDER — MODAFINIL 200 MG PO TABS: 200.0000 mg | ORAL_TABLET | Freq: Every day | ORAL | 5 refills | Status: DC

## 2021-05-02 ENCOUNTER — Other Ambulatory Visit: Payer: Self-pay | Admitting: Adult Health

## 2021-05-05 ENCOUNTER — Telehealth: Payer: Self-pay | Admitting: Adult Health

## 2021-05-05 NOTE — Telephone Encounter (Signed)
Pt has called for a refill on his modafinil (PROVIGIL) 200 MG tablet [facility] on [address]

## 2021-05-05 NOTE — Telephone Encounter (Signed)
Per [place] registry, last filled 02/11/2021 Modafinil 200 Mg Tablet #30/30. Pt has f/u appt in December 2022. Will send Rx refill to Dr Brett Fairy as MM NP is out of office.

## 2021-05-08 ENCOUNTER — Ambulatory Visit (INDEPENDENT_AMBULATORY_CARE_PROVIDER_SITE_OTHER): Payer: Federal, State, Local not specified - PPO

## 2021-05-08 ENCOUNTER — Other Ambulatory Visit: Payer: Self-pay

## 2021-05-08 ENCOUNTER — Encounter: Payer: Self-pay | Admitting: Internal Medicine

## 2021-05-08 ENCOUNTER — Ambulatory Visit: Payer: Federal, State, Local not specified - PPO | Admitting: Internal Medicine

## 2021-05-08 DIAGNOSIS — S99921A Unspecified injury of right foot, initial encounter: Secondary | ICD-10-CM

## 2021-05-08 DIAGNOSIS — M7989 Other specified soft tissue disorders: Secondary | ICD-10-CM | POA: Diagnosis not present

## 2021-05-08 DIAGNOSIS — M79674 Pain in right toe(s): Secondary | ICD-10-CM | POA: Diagnosis not present

## 2021-05-08 DIAGNOSIS — F411 Generalized anxiety disorder: Secondary | ICD-10-CM | POA: Diagnosis not present

## 2021-05-08 NOTE — Patient Instructions (Signed)
Please continue all other medications as before, and refills have been done if requested.  Please have the pharmacy call with any other refills you may need.  Please continue your efforts at being more active, low cholesterol diet, and weight control.  Please keep your appointments with your specialists as you may have planned  Please go to the XRAY Department in the first floor for the x-ray testing  You will be contacted by phone if any changes need to be made immediately.  Otherwise, you will receive a letter about your results with an explanation, but please check with MyChart first.  Please remember to sign up for MyChart if you have not done so, as this will be important to you in the future with finding out test results, communicating by private email, and scheduling acute appointments online when needed.     

## 2021-05-08 NOTE — Assessment & Plan Note (Signed)
Injured twice in 2 wks with persistent pain, swelling, tender and mild erythema but no infection, ulcer evident; for xray and advil prn,  to f/u any worsening symptoms or concerns

## 2021-05-08 NOTE — Assessment & Plan Note (Signed)
Mild to mod chronic persistent, reassured, declines need for change in tx or referral

## 2021-05-08 NOTE — Progress Notes (Signed)
Chief Complaint: follow up right toe injury       HPI:  Shannon Clay is a 25 y.o. adult here with c/o unfortunately hitting the 4th toe right foot twice in 2 wks; the first time on hard object resulting in mod to severe pain, swelling and bruising of the distal foot, 4th and 3rd toes, most of which resolved except for tender 4th toe with persistent swelling; unfortunately then last evening struck the 4th toe again with very severe pain, more swelling but no obvious deformity, and decided to have xray to see if broken.  Has been able to tolerate otherwise, knows about buddy taping but not done as the 3rd toe is still somewhat tender as well.  Pt denies chest pain, increased sob or doe, wheezing, orthopnea, PND, increased LE swelling, palpitations, dizziness or syncope.   Pt denies polydipsia, polyuria, or new focal neuro s/s.        Wt Readings from Last 3 Encounters:  05/08/21 183 lb (83 kg)  04/21/21 187 lb 3.2 oz (84.9 kg)  09/23/20 190 lb 3.2 oz (86.3 kg)   BP Readings from Last 3 Encounters:  05/08/21 122/70  04/21/21 118/74  09/23/20 116/72         Past Medical History:  Diagnosis Date   ADHD (attention deficit hyperactivity disorder)    Asthma    Depression    Frequent headaches    Migraine    History reviewed. No pertinent surgical history.  reports that he has never smoked. He has never used smokeless tobacco. He reports that he does not drink alcohol and does not use drugs. family history includes ADD / ADHD in his mother; Anxiety disorder in his mother and sister; Arthritis in his maternal grandmother; Depression in his paternal aunt and sister; Mental illness in his mother; OCD in his father. Allergies  Allergen Reactions   Latex Swelling    Swelling    Sulfa Antibiotics    Current Outpatient Medications on File Prior to Visit  Medication Sig Dispense Refill   citalopram (CELEXA) 40 MG tablet Take 1 tablet (40 mg total) by mouth daily. 90 tablet 3   modafinil  (PROVIGIL) 200 MG tablet TAKE 1 TABLET BY MOUTH EVERY DAY 30 tablet 4   testosterone cypionate (DEPOTESTOSTERONE CYPIONATE) 200 MG/ML injection INJECT 0.5 MLS (100 MG TOTAL) INTO THE MUSCLE EVERY 14 (FOURTEEN) DAYS. 3 mL 1   No current facility-administered medications on file prior to visit.        ROS:  All others reviewed and negative.  Objective        PE:  BP 122/70   Pulse 86   Temp 98 F (36.7 C) (Oral)   Ht '5\' 3"'$  (1.6 m)   Wt 183 lb (83 kg)   SpO2 96%   BMI 32.42 kg/m                 Constitutional: Pt appears in NAD               HENT: Head: NCAT.                Right Ear: External ear normal.                 Left Ear: External ear normal.                Eyes: . Pupils are equal, round, and reactive to light. Conjunctivae and EOM are normal  Nose: without d/c or deformity               Neck: Neck supple. Gross normal ROM               Cardiovascular: Normal rate and regular rhythm.                 Pulmonary/Chest: Effort normal and breath sounds without rales or wheezing.                Abd:  Soft, NT, ND, + BS, no organomegaly               Neurological: Pt is alert. At baseline orientation, motor grossly intact               Skin: Skin is warm. No rashes, no other new lesions, LE edema - none               4th toe right foot with 2+ redness, swelling without ulcer red streak or deformity               Psychiatric: Pt behavior is normal without agitation 1+ nervous  Micro: none  Cardiac tracings I have personally interpreted today:  none  Pertinent Radiological findings (summarize): none   Lab Results  Component Value Date   WBC 9.8 09/23/2020   HGB 14.8 09/23/2020   HCT 45.0 09/23/2020   PLT 282.0 09/23/2020   GLUCOSE 92 09/23/2020   CHOL 223 (H) 09/23/2020   TRIG 241.0 (H) 09/23/2020   HDL 41.80 09/23/2020   LDLDIRECT 145.0 09/23/2020   LDLCALC 97 02/18/2017   ALT 27 09/23/2020   AST 17 09/23/2020   NA 138 09/23/2020   K 4.2 09/23/2020    CL 102 09/23/2020   CREATININE 0.84 09/23/2020   BUN 16 09/23/2020   CO2 30 09/23/2020   TSH 2.21 02/18/2017   Assessment/Plan:  TARSHA MARCY is a 25 y.o. White or Caucasian [1] adult with  has a past medical history of ADHD (attention deficit hyperactivity disorder), Asthma, Depression, Frequent headaches, and Migraine.  Toe injury, right, initial encounter Injured twice in 2 wks with persistent pain, swelling, tender and mild erythema but no infection, ulcer evident; for xray and advil prn,  to f/u any worsening symptoms or concerns  GAD (generalized anxiety disorder) Mild to mod chronic persistent, reassured, declines need for change in tx or referral  Followup: Return if symptoms worsen or fail to improve.  Cathlean Cower, MD 05/08/2021 9:52 PM [facility]

## 2021-05-10 ENCOUNTER — Encounter: Payer: Self-pay | Admitting: Internal Medicine

## 2021-07-14 ENCOUNTER — Telehealth: Payer: Self-pay

## 2021-07-14 NOTE — Telephone Encounter (Signed)
Submitted PA request for modafinil on CMM, Key: [id number].   Awaiting determination from [employer].

## 2021-07-15 ENCOUNTER — Telehealth: Payer: Federal, State, Local not specified - PPO | Admitting: Emergency Medicine

## 2021-07-17 NOTE — Telephone Encounter (Signed)
PA approved 07/17/21-07/16/22. Ref# [id number]

## 2021-09-03 ENCOUNTER — Encounter: Payer: Self-pay | Admitting: Adult Health

## 2021-09-06 IMAGING — CT CT RENAL STONE PROTOCOL
2 of 4 series · 15 of 46 positions shown, 17 images · non-contrast
Comparison: None.

CLINICAL DATA: 23-year-old presenting with hematuria and dysuria
that began approximately 6 days ago. Symptoms have resolved after
completion of antibiotic therapy.

EXAM:
CT ABDOMEN AND PELVIS WITHOUT CONTRAST
TECHNIQUE: Multidetector CT imaging of the abdomen and pelvis was performed
following the standard protocol without IV contrast.

[Series 2: stone study 5.0 br38 1 · axial · 0.75mm/px · z∈[-471,-86]mm · 12 of 89 slices shown, 14 images]
[im 8/89  soft-tissue]
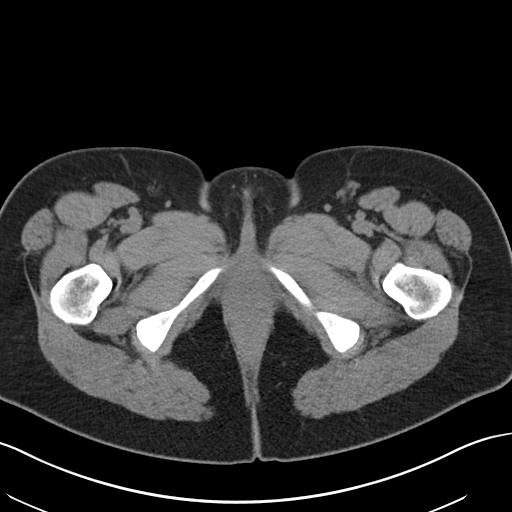
[im 8/89  bone]
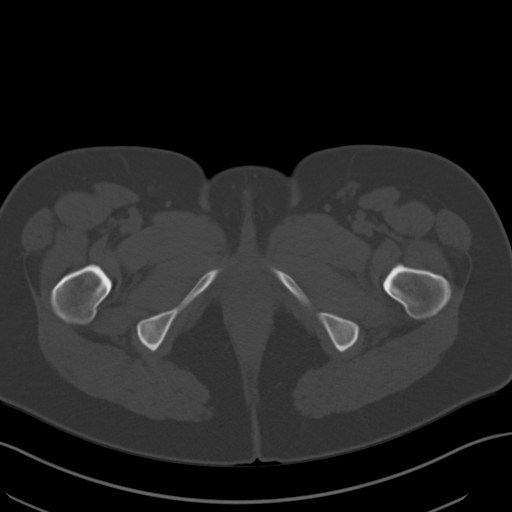
[im 15/89  soft-tissue]
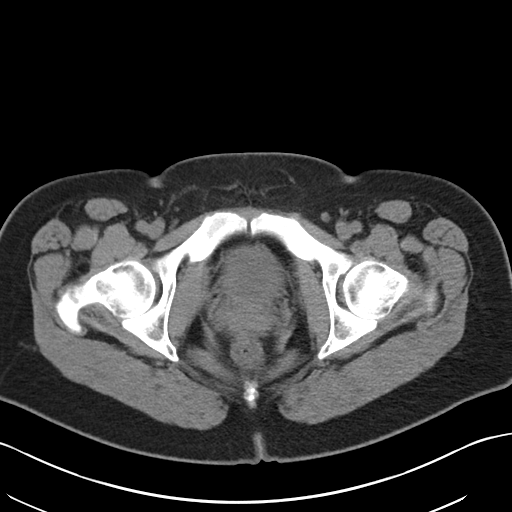
[im 22/89  soft-tissue]
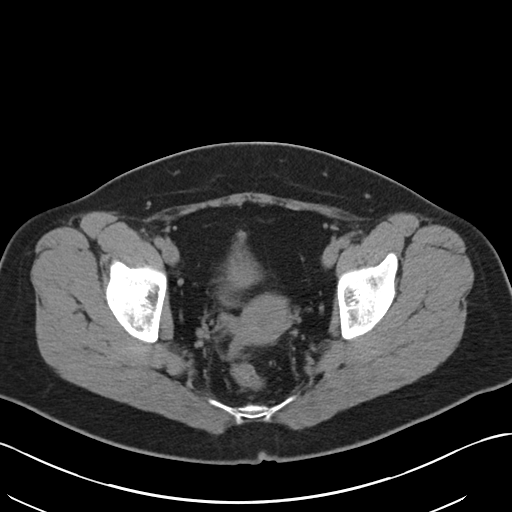
[im 29/89  soft-tissue]
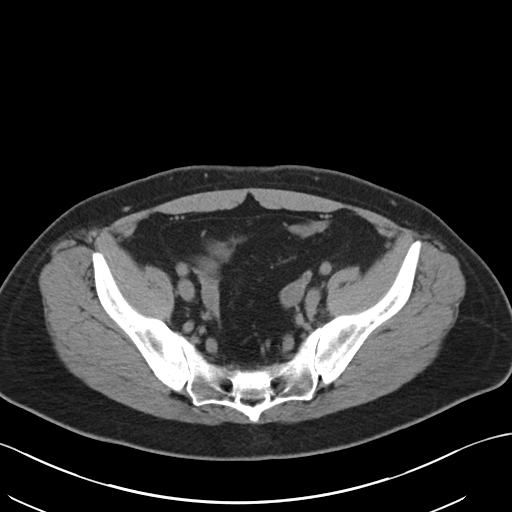
[im 36/89  soft-tissue]
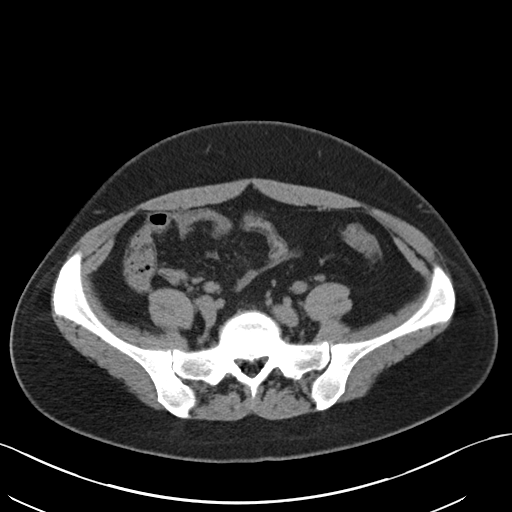
[im 43/89  soft-tissue]
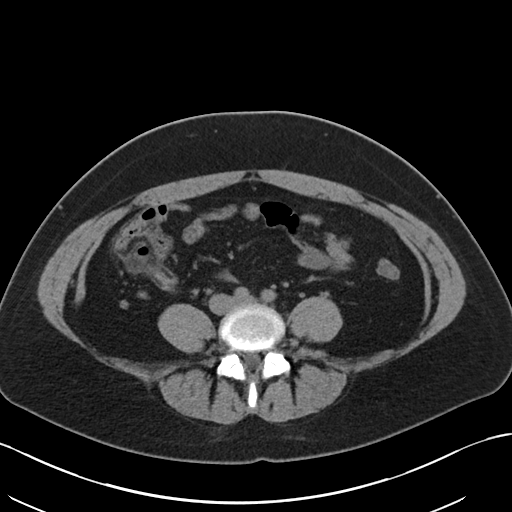
[im 50/89  soft-tissue]
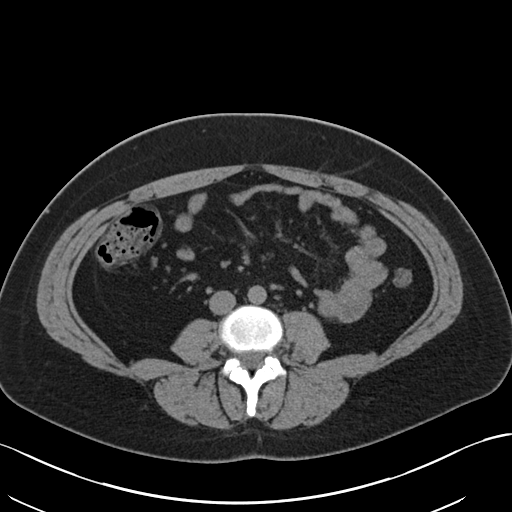
[im 57/89  soft-tissue]
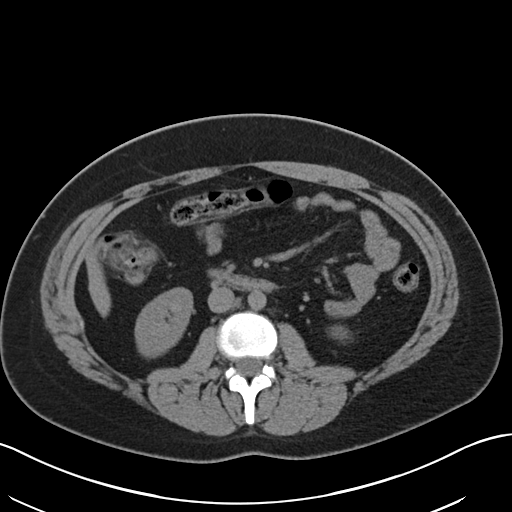
[im 64/89  soft-tissue]
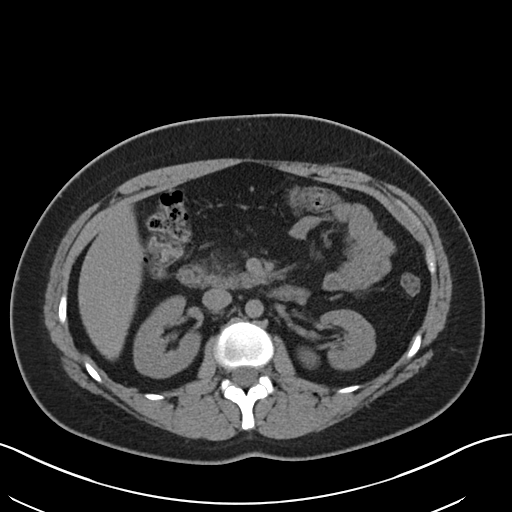
[im 64/89  bone]
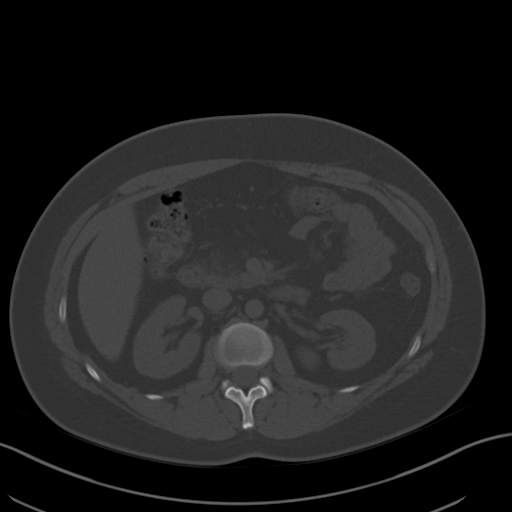
[im 71/89  soft-tissue]
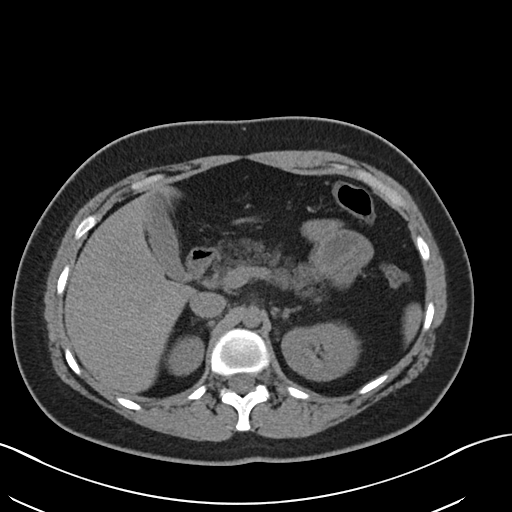
[im 78/89  soft-tissue]
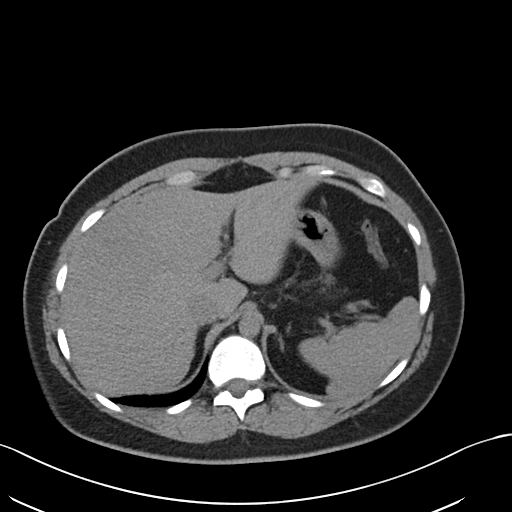
[im 85/89  soft-tissue]
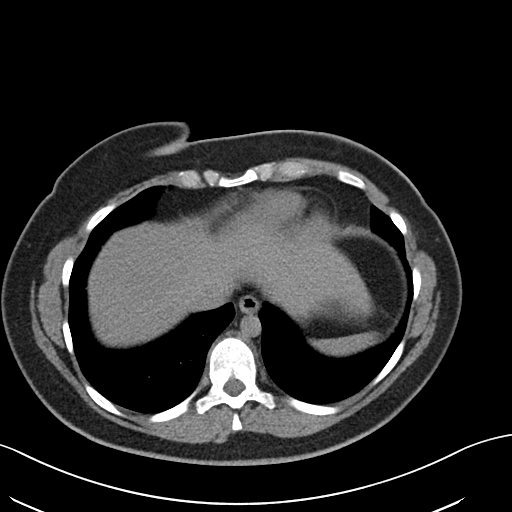

[Series 5: coronal soft tissue · coronal · 0.65mm/px · 3 of 82 slices shown]
[im 28/82  soft-tissue]
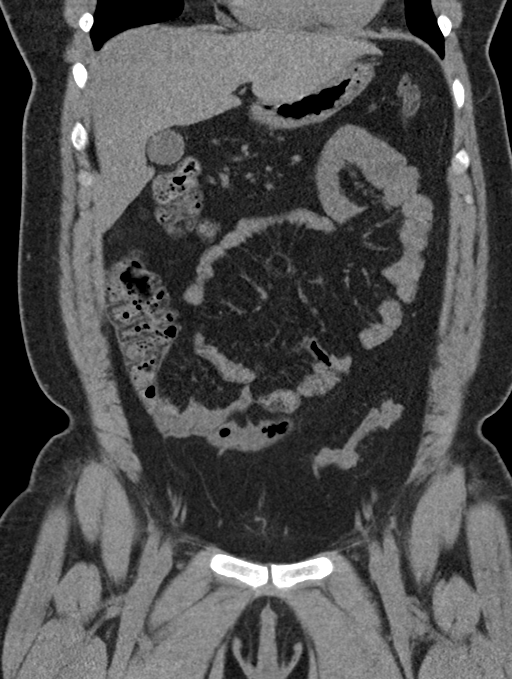
[im 37/82  soft-tissue]
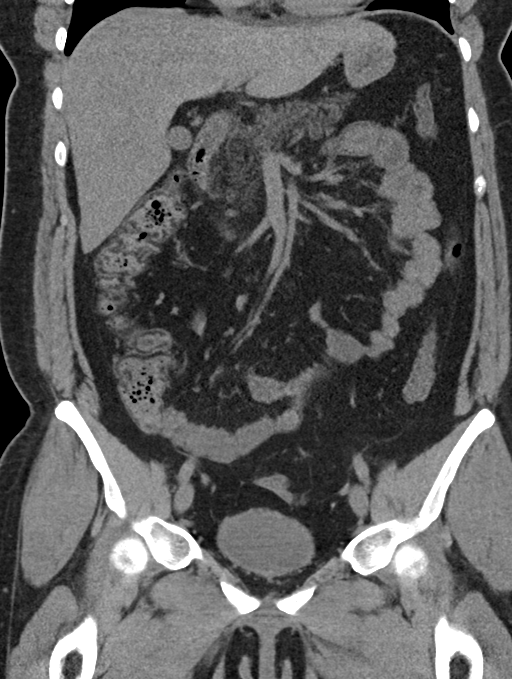
[im 46/82  soft-tissue]
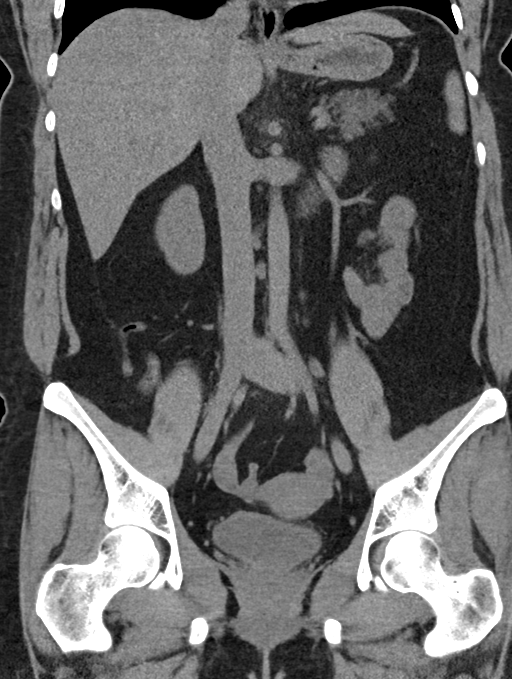

[15 of 46 positions shown; findings below may reference images not displayed]

FINDINGS: Lower chest: Heart size normal.  Visualized lung bases clear.

Hepatobiliary: Normal unenhanced appearance of the liver.
Gallbladder normal in appearance without calcified gallstones. No
biliary ductal dilation.

Pancreas: Focal fatty replacement involving the pancreatic head. No
mass or peripancreatic inflammation.

Spleen: Normal unenhanced appearance.

Adrenals/Urinary Tract: Normal appearing adrenal glands. No evidence
of urinary tract calculi. Within the limits of the unenhanced
technique, no focal parenchymal abnormality involving either kidney.
No evidence of hydronephrosis involving either kidney. Normal
appearing decompressed urinary bladder.

Stomach/Bowel: Stomach normal in appearance for the degree of
distention. Fat within the wall of the terminal ileum, the ascending
colon, transverse colon and proximal and mid descending colon which
can occasionally be a manifestation of remote inflammation.
Remainder of the small bowel and colon normal in appearance. Normal
appendix in the RIGHT mid abdomen/upper pelvis.

Vascular/Lymphatic: No visible atherosclerosis. No pathologic
lymphadenopathy.

Reproductive: Normal-appearing uterus and ovaries without evidence
of adnexal mass.

Other: None.

Musculoskeletal: Regional skeleton unremarkable without acute or
significant osseous abnormality.
IMPRESSION: 1. No acute abnormalities involving the abdomen or pelvis.
2. Fat within the wall of the terminal ileum, the ascending colon,
transverse colon and proximal and mid descending colon which can
occasionally be a manifestation of remote inflammation.
3. Focal fatty replacement involving the pancreatic head.

## 2021-09-25 ENCOUNTER — Encounter: Payer: Self-pay | Admitting: Adult Health

## 2021-09-25 ENCOUNTER — Ambulatory Visit (INDEPENDENT_AMBULATORY_CARE_PROVIDER_SITE_OTHER): Payer: 59 | Admitting: Adult Health

## 2021-09-25 ENCOUNTER — Other Ambulatory Visit: Payer: Self-pay

## 2021-09-25 VITALS — BP 122/69 | HR 87 | Ht 64.0 in | Wt 186.8 lb

## 2021-09-25 DIAGNOSIS — G47411 Narcolepsy with cataplexy: Secondary | ICD-10-CM | POA: Diagnosis not present

## 2021-09-25 MED ORDER — SUNOSI 75 MG PO TABS: 75.0000 mg | ORAL_TABLET | Freq: Every day | ORAL | 5 refills | Status: DC

## 2021-09-25 NOTE — Patient Instructions (Signed)
Your Plan:  Start Sunosi 75 mg daily Stop Provigil     Thank you for coming to see [place] at [facility]. I hope we have been able to provide you high quality care today.  You may receive a patient satisfaction survey over the next few weeks. We would appreciate your feedback and comments so that we may continue to improve ourselves and the health of our patients.

## 2021-09-25 NOTE — Progress Notes (Signed)
PATIENT: Shannon Clay DOB: [birth year]  REASON FOR VISIT: follow up HISTORY FROM: patient PRIMARY NEUROLOGIST: Dr. Brett Fairy  HISTORY OF PRESENT ILLNESS: Today 09/25/21:  Mr. 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  He returns today for follow-up.  He reports that Provigil is not working well for him.  He states that he is still exhausted throughout the day.  He is wanting to try a new medication.  Provigil is the only medication that he has tried.  He does struggle with depression.  He does see a psychiatrist that has been on 2 medication.  He  does feel that it is getting better.  He returns today for follow-up.   REVIEW OF SYSTEMS: Out of a complete 14 system review of symptoms, the patient complains only of the following symptoms, and all other reviewed systems are negative.  See HPI  ALLERGIES: Allergies  Allergen Reactions   Latex Swelling    Swelling    Sulfa Antibiotics     HOME MEDICATIONS: Outpatient Medications Prior to Visit  Medication Sig Dispense Refill   buPROPion (WELLBUTRIN XL) 150 MG 24 hr tablet Take 150 mg by mouth every morning.     citalopram (CELEXA) 40 MG tablet Take 1 tablet (40 mg total) by mouth daily. 90 tablet 3   modafinil (PROVIGIL) 200 MG tablet TAKE 1 TABLET BY MOUTH EVERY DAY 30 tablet 4   testosterone cypionate (DEPOTESTOSTERONE CYPIONATE) 200 MG/ML injection INJECT 0.5 MLS (100 MG TOTAL) INTO THE MUSCLE EVERY 14 (FOURTEEN) DAYS. 3 mL 1   No facility-administered medications prior to visit.    PAST MEDICAL HISTORY: Past Medical History:  Diagnosis Date   ADHD (attention deficit hyperactivity disorder)    Asthma    Depression    Frequent headaches    Migraine     PAST SURGICAL HISTORY: History reviewed. No pertinent surgical history.  FAMILY HISTORY: Family History  Problem Relation Age of Onset   ADD / ADHD Mother    Anxiety disorder Mother    Mental illness Mother    OCD Father    Anxiety disorder  Sister    Depression Sister    Narcolepsy Maternal Aunt    Depression Paternal Aunt    Arthritis Maternal Grandmother     SOCIAL HISTORY: Social History   Socioeconomic History   Marital status: Single    Spouse name: Not on file   Number of children: Not on file   Years of education: Not on file   Highest education level: Not on file  Occupational History   Not on file  Tobacco Use   Smoking status: Never   Smokeless tobacco: Never  Vaping Use   Vaping Use: Some days  Substance and Sexual Activity   Alcohol use: Yes    Comment: social   Drug use: No   Sexual activity: Never  Other Topics Concern   Not on file  Social History Narrative   Not on file   Social Determinants of Health   Financial Resource Strain: Not on file  Food Insecurity: Not on file  Transportation Needs: Not on file  Physical Activity: Not on file  Stress: Not on file  Social Connections: Not on file  Intimate Partner Violence: Not on file      PHYSICAL EXAM  Vitals:   09/25/21 0814  BP: 122/69  Pulse: 87  Weight: 186 lb 12.8 oz (84.7 kg)  Height: 5\' 4"  (1.626 m)   Body mass index is  32.06 kg/m.  Generalized: Well developed, in no acute distress   Neurological examination  Mentation: Alert oriented to time, place, history taking. Follows all commands speech and language fluent Cranial nerve II-XII: Pupils were equal round reactive to light. Extraocular movements were full, visual field were full on confrontational test. Facial sensation and strength were normal. . Head turning and shoulder shrug  were normal and symmetric. Motor: The motor testing reveals 5 over 5 strength of all 4 extremities. Good symmetric motor tone is noted throughout.  Sensory: Sensory testing is intact to soft touch on all 4 extremities. No evidence of extinction is noted.  Coordination: Cerebellar testing reveals good finger-nose-finger and heel-to-shin bilaterally.  Gait and station: Gait is normal.   Reflexes: Deep tendon reflexes are symmetric and normal bilaterally.   DIAGNOSTIC DATA (LABS, IMAGING, TESTING) - I reviewed patient records, labs, notes, testing and imaging myself where available.  Lab Results  Component Value Date   WBC 9.8 09/23/2020   HGB 14.8 09/23/2020   HCT 45.0 09/23/2020   MCV 88.8 09/23/2020   PLT 282.0 09/23/2020      Component Value Date/Time   NA 138 09/23/2020 1340   NA 143 11/25/2017 0921   K 4.2 09/23/2020 1340   CL 102 09/23/2020 1340   CO2 30 09/23/2020 1340   GLUCOSE 92 09/23/2020 1340   BUN 16 09/23/2020 1340   BUN 13 11/25/2017 0921   CREATININE 0.84 09/23/2020 1340   CALCIUM 9.6 09/23/2020 1340   PROT 7.4 09/23/2020 1340   PROT 6.3 11/25/2017 0921   ALBUMIN 4.6 09/23/2020 1340   ALBUMIN 4.5 11/25/2017 0921   AST 17 09/23/2020 1340   ALT 27 09/23/2020 1340   ALKPHOS 57 09/23/2020 1340   BILITOT 0.3 09/23/2020 1340   BILITOT 0.5 11/25/2017 0921   GFRNONAA 108 11/25/2017 0921   GFRAA 125 11/25/2017 0921   Lab Results  Component Value Date   CHOL 223 (H) 09/23/2020   HDL 41.80 09/23/2020   LDLCALC 97 02/18/2017   LDLDIRECT 145.0 09/23/2020   TRIG 241.0 (H) 09/23/2020   CHOLHDL 5 09/23/2020   Lab Results  Component Value Date   TSH 2.21 02/18/2017      ASSESSMENT AND PLAN 25 y.o. year old adult  has a past medical history of ADHD (attention deficit hyperactivity disorder), Asthma, Depression, Frequent headaches, and Migraine. here with :  1.  Narcolepsy  We will try Sunosi 75 mg daily if this is not helpful can increase to 150 mg daily Stop Provigil May consider Wakix in the future.  Xyrem is not a good option due to the patient's ongoing depression. Follow-up in 6 months or sooner if needed     Ward Givens, MSN, NP-C 09/25/2021, 8:58 AM [facility] [address], [place], [place] [address] [phone]

## 2021-09-29 ENCOUNTER — Telehealth: Payer: Self-pay

## 2021-09-29 NOTE — Telephone Encounter (Signed)
The request has been approved. The authorization is effective for a maximum of 6 fills from 09/29/2021 to 03/29/2022, as long as the member is enrolled in their current health plan. The request was approved with a quantity restriction. This has been approved for a max daily dosage of 1. A written notification letter will follow with additional details.  Faxed approval letter to [facility] in [place]. Received a receipt of confirmation.

## 2021-09-29 NOTE — Telephone Encounter (Signed)
I have submitted a PA request on CMM, Key: [id number], for Sunosi 75mg .  Awaiting determination from [employer].

## 2021-11-13 ENCOUNTER — Telehealth: Payer: Self-pay | Admitting: Adult Health

## 2021-11-13 NOTE — Telephone Encounter (Signed)
Pt called stating that she has new insurance and that a new Rx will have to be called in to them. Pt is needing a refill for the Solriamfetol HCl (SUNOSI) 75 MG TABS  [employer] [facility] Member ID # [id number] RxBIN# [id number] Provider Line [phone] [facility]

## 2021-11-13 NOTE — Telephone Encounter (Signed)
PA completed through CMM/ [employer] KEY: [id number] Will await decision

## 2021-11-13 NOTE — Telephone Encounter (Signed)
Pt had a script sent to this pharmacy in dec with 5 refills. Once insurance goes through they should be able to fill the medication.

## 2021-11-14 NOTE — Telephone Encounter (Signed)
PA approved from 11/13/2021 to 11/13/2022 through [place]

## 2021-11-17 ENCOUNTER — Other Ambulatory Visit: Payer: Self-pay | Admitting: Neurology

## 2021-11-17 NOTE — Telephone Encounter (Signed)
Received refill request for modafinil.  Last OV was on 09/25/21.  Next OV is scheduled for 03/26/22 .  Last RX was written on 10/07/21 for 30 tabs.   [facility] has been reviewed.

## 2021-11-17 NOTE — Telephone Encounter (Signed)
Faxed approval letter to pt's pharmacy. Received a receipt of confirmation.

## 2021-11-18 ENCOUNTER — Telehealth: Payer: Self-pay | Admitting: Neurology

## 2021-11-18 ENCOUNTER — Encounter: Payer: Self-pay | Admitting: Internal Medicine

## 2021-11-18 DIAGNOSIS — L989 Disorder of the skin and subcutaneous tissue, unspecified: Secondary | ICD-10-CM

## 2021-11-18 NOTE — Telephone Encounter (Signed)
Went to initiate a PA on CMM for Modafinil and saw where pt is switching to [facility]. I have deleted the PA in [facility].

## 2021-12-01 ENCOUNTER — Encounter: Payer: Self-pay | Admitting: Internal Medicine

## 2021-12-01 ENCOUNTER — Ambulatory Visit (INDEPENDENT_AMBULATORY_CARE_PROVIDER_SITE_OTHER): Payer: 59 | Admitting: Internal Medicine

## 2021-12-01 ENCOUNTER — Other Ambulatory Visit: Payer: Self-pay

## 2021-12-01 ENCOUNTER — Telehealth: Payer: Self-pay | Admitting: *Deleted

## 2021-12-01 VITALS — BP 120/76 | HR 87 | Temp 98.7°F | Ht 64.0 in | Wt 183.1 lb

## 2021-12-01 DIAGNOSIS — J209 Acute bronchitis, unspecified: Secondary | ICD-10-CM | POA: Insufficient documentation

## 2021-12-01 DIAGNOSIS — J4520 Mild intermittent asthma, uncomplicated: Secondary | ICD-10-CM | POA: Diagnosis not present

## 2021-12-01 DIAGNOSIS — R0981 Nasal congestion: Secondary | ICD-10-CM

## 2021-12-01 DIAGNOSIS — R059 Cough, unspecified: Secondary | ICD-10-CM

## 2021-12-01 DIAGNOSIS — R0609 Other forms of dyspnea: Secondary | ICD-10-CM | POA: Diagnosis not present

## 2021-12-01 LAB — POC COVID19 BINAXNOW: SARS Coronavirus 2 Ag: NEGATIVE

## 2021-12-01 MED ORDER — HYDROCODONE BIT-HOMATROP MBR 5-1.5 MG/5ML PO SOLN: 5.0000 mL | Freq: Three times a day (TID) | ORAL | 0 refills | Status: DC | PRN

## 2021-12-01 MED ORDER — CEFDINIR 300 MG PO CAPS: 300.0000 mg | ORAL_CAPSULE | Freq: Two times a day (BID) | ORAL | 0 refills | Status: DC

## 2021-12-01 MED ORDER — ALBUTEROL SULFATE HFA 108 (90 BASE) MCG/ACT IN AERS: 2.0000 | INHALATION_SPRAY | RESPIRATORY_TRACT | 6 refills | Status: AC | PRN

## 2021-12-01 NOTE — Assessment & Plan Note (Addendum)
Bronchitis vs CAP Off work x 3 d CXR Cefdinir x 10 d Hycodan prn - night time DayQuil/NyQuil prn Call if worse  COVID (-)

## 2021-12-01 NOTE — Assessment & Plan Note (Signed)
R/o CAP CXR Treat URI

## 2021-12-01 NOTE — Progress Notes (Addendum)
Subjective:  Patient ID: Shannon Clay, adult    DOB: 06-02-1996  Age: 26 y.o. MRN: [id number]  CC: Nasal Congestion (Chest congestion /) and Cough (X  3 days )   HPI [name] presents for severe cough, chest tightness, SOB/DOE, chills x 3 -4 days - getting worse.Marland KitchenMarland KitchenDid not sleep last night due to cough  Outpatient Medications Prior to Visit  Medication Sig Dispense Refill   buPROPion (WELLBUTRIN XL) 150 MG 24 hr tablet Take 150 mg by mouth every morning.     citalopram (CELEXA) 40 MG tablet Take 1 tablet (40 mg total) by mouth daily. 90 tablet 3   Solriamfetol HCl (SUNOSI) 75 MG TABS Take 75 mg by mouth daily. 30 tablet 5   testosterone cypionate (DEPOTESTOSTERONE CYPIONATE) 200 MG/ML injection INJECT 0.5 MLS (100 MG TOTAL) INTO THE MUSCLE EVERY 14 (FOURTEEN) DAYS. 3 mL 1   No facility-administered medications prior to visit.    ROS: Review of Systems  Constitutional:  Positive for chills and fatigue. Negative for activity change, appetite change and unexpected weight change.  HENT:  Positive for congestion and postnasal drip. Negative for mouth sores and sinus pressure.   Eyes:  Negative for visual disturbance.  Respiratory:  Positive for cough, chest tightness and shortness of breath. Negative for wheezing.   Gastrointestinal:  Negative for abdominal pain and nausea.  Genitourinary:  Negative for difficulty urinating, frequency and vaginal pain.  Musculoskeletal:  Positive for gait problem. Negative for back pain.  Skin:  Negative for pallor and rash.  Neurological:  Positive for weakness. Negative for dizziness, tremors, numbness and headaches.  Psychiatric/Behavioral:  Negative for confusion and sleep disturbance.    Objective:  BP 120/76    Pulse 87    Temp 98.7 F (37.1 C) (Oral)    Ht 5\' 4"  (1.626 m)    Wt 183 lb 2 oz (83.1 kg)    SpO2 97%    BMI 31.43 kg/m   BP Readings from Last 3 Encounters:  12/01/21 120/76  09/25/21 122/69  05/08/21 122/70    Wt  Readings from Last 3 Encounters:  12/01/21 183 lb 2 oz (83.1 kg)  09/25/21 186 lb 12.8 oz (84.7 kg)  05/08/21 183 lb (83 kg)    Physical Exam Constitutional:      General: He is not in acute distress.    Appearance: He is well-developed. He is not ill-appearing or toxic-appearing.     Comments: NAD  HENT:     Head: Normocephalic.     Right Ear: External ear normal.     Left Ear: External ear normal.     Nose: Congestion and rhinorrhea present.     Mouth/Throat:     Pharynx: Oropharyngeal exudate and posterior oropharyngeal erythema present.  Eyes:     General:        Right eye: No discharge.        Left eye: No discharge.     Conjunctiva/sclera: Conjunctivae normal.     Pupils: Pupils are equal, round, and reactive to light.  Neck:     Thyroid: No thyromegaly.     Vascular: No JVD.     Trachea: No tracheal deviation.  Cardiovascular:     Rate and Rhythm: Normal rate and regular rhythm.     Heart sounds: Normal heart sounds. No murmur heard.   No friction rub. No gallop.  Pulmonary:     Effort: No respiratory distress.     Breath sounds: No stridor. Rales  present. No wheezing.  Chest:     Chest wall: No tenderness.  Abdominal:     General: Bowel sounds are normal. There is no distension.     Palpations: Abdomen is soft. There is no mass.     Tenderness: There is no abdominal tenderness. There is no guarding or rebound.  Musculoskeletal:        General: No tenderness. Normal range of motion.     Cervical back: Normal range of motion and neck supple. No rigidity.  Lymphadenopathy:     Cervical: No cervical adenopathy.  Skin:    General: Skin is warm and dry.     Findings: No erythema or rash.  Neurological:     Mental Status: He is alert and oriented to person, place, and time.     Cranial Nerves: No cranial nerve deficit.     Motor: No abnormal muscle tone.     Coordination: Coordination normal.     Gait: Gait abnormal.     Deep Tendon Reflexes: Reflexes are  normal and symmetric. Reflexes normal.  Psychiatric:        Behavior: Behavior normal.        Thought Content: Thought content normal.        Judgment: Judgment normal.  Eryth throat Decr BS at bases w/some rhonchi Spastic cough  Lab Results  Component Value Date   WBC 9.8 09/23/2020   HGB 14.8 09/23/2020   HCT 45.0 09/23/2020   PLT 282.0 09/23/2020   GLUCOSE 92 09/23/2020   CHOL 223 (H) 09/23/2020   TRIG 241.0 (H) 09/23/2020   HDL 41.80 09/23/2020   LDLDIRECT 145.0 09/23/2020   LDLCALC 97 02/18/2017   ALT 27 09/23/2020   AST 17 09/23/2020   NA 138 09/23/2020   K 4.2 09/23/2020   CL 102 09/23/2020   CREATININE 0.84 09/23/2020   BUN 16 09/23/2020   CO2 30 09/23/2020   TSH 2.21 02/18/2017    CT RENAL STONE STUDY  Result Date: 11/22/2019 CLINICAL DATA:  26 year old presenting with hematuria and dysuria that began approximately 6 days ago. Symptoms have resolved after completion of antibiotic therapy. EXAM: CT ABDOMEN AND PELVIS WITHOUT CONTRAST TECHNIQUE: Multidetector CT imaging of the abdomen and pelvis was performed following the standard protocol without IV contrast. COMPARISON:  None. FINDINGS: Lower chest: Heart size normal.  Visualized lung bases clear. Hepatobiliary: Normal unenhanced appearance of the liver. Gallbladder normal in appearance without calcified gallstones. No biliary ductal dilation. Pancreas: Focal fatty replacement involving the pancreatic head. No mass or peripancreatic inflammation. Spleen: Normal unenhanced appearance. Adrenals/Urinary Tract: Normal appearing adrenal glands. No evidence of urinary tract calculi. Within the limits of the unenhanced technique, no focal parenchymal abnormality involving either kidney. No evidence of hydronephrosis involving either kidney. Normal appearing decompressed urinary bladder. Stomach/Bowel: Stomach normal in appearance for the degree of distention. Fat within the wall of the terminal ileum, the ascending colon,  transverse colon and proximal and mid descending colon which can occasionally be a manifestation of remote inflammation. Remainder of the small bowel and colon normal in appearance. Normal appendix in the RIGHT mid abdomen/upper pelvis. Vascular/Lymphatic: No visible atherosclerosis. No pathologic lymphadenopathy. Reproductive: Normal-appearing uterus and ovaries without evidence of adnexal mass. Other: None. Musculoskeletal: Regional skeleton unremarkable without acute or significant osseous abnormality. IMPRESSION: 1. No acute abnormalities involving the abdomen or pelvis. 2. Fat within the wall of the terminal ileum, the ascending colon, transverse colon and proximal and mid descending colon which can occasionally be a manifestation  of remote inflammation. 3. Focal fatty replacement involving the pancreatic head. Electronically Signed   By: Evangeline Dakin M.D.   On: 11/22/2019 13:21    Assessment & Plan:   Problem List Items Addressed This Visit     Acute bronchitis    Bronchitis vs CAP Off work x 3 d CXR Cefdinir x 10 d Hycodan prn - night time DayQuil/NyQuil prn Call if worse      Asthma    Flare up due to URI Advised Prevnar 20 Has not used MDI in many years Albuterol MDI if needed       Relevant Medications   albuterol (PROAIR HFA) 108 (90 Base) MCG/ACT inhaler   DOE (dyspnea on exertion)    R/o CAP CXR Treat URI      Other Visit Diagnoses     Cough, unspecified type    -  Primary   Relevant Orders   POC COVID-19 (Completed)   DG Chest 2 View   Nasal congestion       Relevant Orders   POC COVID-19 (Completed)         Meds ordered this encounter  Medications   HYDROcodone bit-homatropine (HYCODAN) 5-1.5 MG/5ML syrup    Sig: Take 5 mLs by mouth every 8 (eight) hours as needed for cough.    Dispense:  120 mL    Refill:  0   cefdinir (OMNICEF) 300 MG capsule    Sig: Take 1 capsule (300 mg total) by mouth 2 (two) times daily.    Dispense:  20 capsule     Refill:  0   albuterol (PROAIR HFA) 108 (90 Base) MCG/ACT inhaler    Sig: Inhale 2 puffs into the lungs every 4 (four) hours as needed for wheezing or shortness of breath.    Dispense:  18 g    Refill:  6      Follow-up: Return for a follow-up visit.  Walker Kehr, MD

## 2021-12-01 NOTE — Assessment & Plan Note (Addendum)
Flare up due to URI Advised Prevnar 20 Has not used MDI in many years Albuterol MDI if needed

## 2021-12-01 NOTE — Addendum Note (Signed)
Addended by: Cassandria Anger on: 12/01/2021 05:18 PM   Modules accepted: Orders

## 2021-12-01 NOTE — Patient Instructions (Signed)
You can use over-the-counter  "cold" medicines  such as "Tylenol cold" , "Advil cold",  "Mucinex" or" Mucinex D"  for cough and congestion.   Avoid decongestants if you have high blood pressure and use "Afrin" nasal spray for nasal congestion as directed. Use " Delsym" or" Robitussin" cough syrup varietis for cough.  You can use plain "Tylenol" or "Advil" for fever, chills and achyness. Use Halls or Ricola cough drops.   Please, make an appointment if you are not better or if you're worse.  

## 2021-12-02 ENCOUNTER — Ambulatory Visit (INDEPENDENT_AMBULATORY_CARE_PROVIDER_SITE_OTHER): Payer: 59

## 2021-12-02 ENCOUNTER — Telehealth: Payer: 59 | Admitting: Internal Medicine

## 2021-12-02 DIAGNOSIS — R059 Cough, unspecified: Secondary | ICD-10-CM | POA: Diagnosis not present

## 2021-12-02 DIAGNOSIS — J189 Pneumonia, unspecified organism: Secondary | ICD-10-CM | POA: Diagnosis not present

## 2021-12-02 NOTE — Telephone Encounter (Signed)
Erroneous entry

## 2021-12-03 ENCOUNTER — Other Ambulatory Visit: Payer: Self-pay | Admitting: Internal Medicine

## 2021-12-03 ENCOUNTER — Emergency Department (HOSPITAL_COMMUNITY)
Admission: EM | Admit: 2021-12-03 | Discharge: 2021-12-03 | Disposition: A | Discharge status: Home / Self Care | Payer: 59 | Attending: Emergency Medicine | Admitting: Emergency Medicine

## 2021-12-03 ENCOUNTER — Emergency Department (HOSPITAL_COMMUNITY): Payer: 59

## 2021-12-03 DIAGNOSIS — R000 Tachycardia, unspecified: Secondary | ICD-10-CM | POA: Diagnosis not present

## 2021-12-03 DIAGNOSIS — Z91040 Latex allergy status: Secondary | ICD-10-CM | POA: Insufficient documentation

## 2021-12-03 DIAGNOSIS — R0602 Shortness of breath: Secondary | ICD-10-CM | POA: Diagnosis not present

## 2021-12-03 DIAGNOSIS — J189 Pneumonia, unspecified organism: Secondary | ICD-10-CM | POA: Diagnosis not present

## 2021-12-03 DIAGNOSIS — R531 Weakness: Secondary | ICD-10-CM | POA: Diagnosis not present

## 2021-12-03 LAB — CBC WITH DIFFERENTIAL/PLATELET
Abs Immature Granulocytes: 0.03 10*3/uL (ref 0.00–0.07)
Basophils Absolute: 0 10*3/uL (ref 0.0–0.1)
Basophils Relative: 1 %
Eosinophils Absolute: 0.1 10*3/uL (ref 0.0–0.5)
Eosinophils Relative: 2 %
HCT: 46.1 % — ABNORMAL HIGH (ref 36.0–46.0)
Hemoglobin: 15.6 g/dL — ABNORMAL HIGH (ref 12.0–15.0)
Immature Granulocytes: 1 %
Lymphocytes Relative: 18 %
Lymphs Abs: 1 10*3/uL (ref 0.7–4.0)
MCH: 29.4 pg (ref 26.0–34.0)
MCHC: 33.8 g/dL (ref 30.0–36.0)
MCV: 86.8 fL (ref 80.0–100.0)
Monocytes Absolute: 0.6 10*3/uL (ref 0.1–1.0)
Monocytes Relative: 11 %
Neutro Abs: 4 10*3/uL (ref 1.7–7.7)
Neutrophils Relative %: 67 %
Platelets: 212 10*3/uL (ref 150–400)
RBC: 5.31 MIL/uL — ABNORMAL HIGH (ref 3.87–5.11)
RDW: 12.2 % (ref 11.5–15.5)
WBC: 5.9 10*3/uL (ref 4.0–10.5)
nRBC: 0 % (ref 0.0–0.2)

## 2021-12-03 LAB — BASIC METABOLIC PANEL
Anion gap: 7 (ref 5–15)
BUN: 9 mg/dL (ref 6–20)
CO2: 24 mmol/L (ref 22–32)
Calcium: 8.6 mg/dL — ABNORMAL LOW (ref 8.9–10.3)
Chloride: 102 mmol/L (ref 98–111)
Creatinine, Ser: 0.87 mg/dL (ref 0.44–1.00)
GFR, Estimated: >60 mL/min (ref >60)
Glucose, Bld: 105 mg/dL — ABNORMAL HIGH (ref 70–99)
Potassium: 3.6 mmol/L (ref 3.5–5.1)
Sodium: 133 mmol/L — ABNORMAL LOW (ref 135–145)

## 2021-12-03 LAB — MAGNESIUM: Magnesium: 1.9 mg/dL (ref 1.7–2.4)

## 2021-12-03 LAB — HEPATIC FUNCTION PANEL
ALT: 24 U/L (ref 0–44)
AST: 22 U/L (ref 15–41)
Albumin: 4.3 g/dL (ref 3.5–5.0)
Alkaline Phosphatase: 58 U/L (ref 38–126)
Bilirubin, Direct: 0.1 mg/dL (ref 0.0–0.2)
Indirect Bilirubin: 0.4 mg/dL (ref 0.3–0.9)
Total Bilirubin: 0.5 mg/dL (ref 0.3–1.2)
Total Protein: 7.1 g/dL (ref 6.5–8.1)

## 2021-12-03 MED ORDER — AZITHROMYCIN 250 MG PO TABS: ORAL_TABLET | ORAL | 0 refills | Status: DC

## 2021-12-03 MED ORDER — IPRATROPIUM-ALBUTEROL 0.5-2.5 (3) MG/3ML IN SOLN
3.0000 mL | Freq: Once | RESPIRATORY_TRACT | Status: AC
  Administered 2021-12-03: 3 mL via RESPIRATORY_TRACT
  Filled 2021-12-03: qty 3

## 2021-12-03 MED ORDER — SODIUM CHLORIDE 0.9 % IV SOLN
1.0000 g | Freq: Once | INTRAVENOUS | Status: AC
  Administered 2021-12-03: 1 g via INTRAVENOUS
  Filled 2021-12-03: qty 10

## 2021-12-03 MED ORDER — LACTATED RINGERS IV BOLUS
1000.0000 mL | Freq: Once | INTRAVENOUS | Status: AC
  Administered 2021-12-03: 1000 mL via INTRAVENOUS

## 2021-12-03 MED ORDER — FLUCONAZOLE 200 MG PO TABS: 200.0000 mg | ORAL_TABLET | Freq: Every day | ORAL | 0 refills | Status: AC

## 2021-12-03 MED ORDER — SODIUM CHLORIDE 0.9 % IV SOLN
500.0000 mg | Freq: Once | INTRAVENOUS | Status: AC
  Administered 2021-12-03: 500 mg via INTRAVENOUS
  Filled 2021-12-03: qty 5

## 2021-12-03 NOTE — ED Triage Notes (Signed)
Pt reports being diagnosed with pneumonia 3 days ago. States today the shortness of breath has been worse. Pt is on antibiotics and inhaler.

## 2021-12-03 NOTE — Discharge Instructions (Signed)
Your x-ray today shows that the pneumonia in your left lower lobe is a little bit worse than when you were seen yesterday at your primary care doctor.  We have given you 2 different IV antibiotics while you are here today.  Take the 2 prescribed antibiotics given to you by your PCP as directed.  You can take the azithromycin starting tomorrow.  Return to the ED if you develop worsening fevers, shortness of breath or difficulty breathing.  I have also sent you in a prescription for Diflucan in case you develop a yeast infection.

## 2021-12-03 NOTE — ED Provider Notes (Signed)
[facility] DEPT Provider Note   CSN: [id number] Arrival date & time: 12/03/21  5188     History  Chief Complaint  Patient presents with   Shortness of Shannon Clay is a 26 y.o. adult who presents to the ED for for evaluation of worsening shortness of breath after pneumonia diagnosis yesterday.  Patient went to his PCP yesterday where x-ray confirmed pneumonia of the left lung.  He was started on an inhaler, azithromycin and cefdinir.  Per patient, he thought he only had 1 antibiotic and has not been taking the prescribed two medications. He does not know which antibiotic he has been using or not.  He has been using his inhaler with some relief.  Overnight, he noticed worsening shortness of breath with blue fingertips.  He endorses left-sided chest pain.  He denies fevers, abdominal pain, nausea, vomiting, diarrhea.   Shortness of Breath     Home Medications Prior to Admission medications   Medication Sig Start Date End Date Taking? Authorizing Provider  acetaminophen (TYLENOL) 500 MG tablet Take 500 mg by mouth every 6 (six) hours as needed for mild pain.   Yes [provider]  albuterol (PROAIR HFA) 108 (90 Base) MCG/ACT inhaler Inhale 2 puffs into the lungs every 4 (four) hours as needed for wheezing or shortness of breath. 12/01/21 12/01/22 Yes Plotnikov, Evie Lacks, MD  buPROPion (WELLBUTRIN XL) 150 MG 24 hr tablet Take 150 mg by mouth every morning. 08/12/21  Yes [provider]  cefdinir (OMNICEF) 300 MG capsule Take 1 capsule (300 mg total) by mouth 2 (two) times daily. 12/01/21  Yes Plotnikov, Evie Lacks, MD  citalopram (CELEXA) 40 MG tablet Take 1 tablet (40 mg total) by mouth daily. 04/21/21  Yes Hoyt Koch, MD  fluconazole (DIFLUCAN) 200 MG tablet Take 1 tablet (200 mg total) by mouth daily for 7 days. 12/03/21 12/10/21 Yes Kathe Becton R, PA-C  HYDROcodone bit-homatropine (HYCODAN) 5-1.5 MG/5ML syrup Take 5 mLs  by mouth every 8 (eight) hours as needed for cough. 12/01/21  Yes Plotnikov, Evie Lacks, MD  Solriamfetol HCl (SUNOSI) 75 MG TABS Take 75 mg by mouth daily. 09/25/21  Yes Ward Givens, NP  testosterone cypionate (DEPOTESTOSTERONE CYPIONATE) 200 MG/ML injection INJECT 0.5 MLS (100 MG TOTAL) INTO THE MUSCLE EVERY 14 (FOURTEEN) DAYS. 03/26/21  Yes Hoyt Koch, MD  azithromycin (ZITHROMAX Z-PAK) 250 MG tablet As directed Patient taking differently: Take 250 mg by mouth as directed. As directed 12/03/21   Plotnikov, Evie Lacks, MD      Allergies    Latex and Sulfa antibiotics    Review of Systems   Review of Systems  Respiratory:  Positive for shortness of breath.    Physical Exam Updated Vital Signs BP (!) 118/56    Pulse 93    Temp 98 F (36.7 C) (Oral)    Resp 16    Ht 5\' 4"  (1.626 m)    Wt 83 kg    SpO2 95%    BMI 31.41 kg/m  Physical Exam Vitals and nursing note reviewed.  Constitutional:      General: He is not in acute distress.    Appearance: He is not ill-appearing.  HENT:     Head: Atraumatic.  Eyes:     Conjunctiva/sclera: Conjunctivae normal.  Cardiovascular:     Rate and Rhythm: Regular rhythm. Tachycardia present.     Pulses: Normal pulses.     Heart sounds: No murmur  heard. Pulmonary:     Effort: No respiratory distress.     Breath sounds: Normal breath sounds.     Comments: Increased work of breathing with fine rales of the left lower lobe.  Right lung CTA. Abdominal:     General: Abdomen is flat. There is no distension.     Palpations: Abdomen is soft.     Tenderness: There is no abdominal tenderness.  Musculoskeletal:        General: Normal range of motion.     Cervical back: Normal range of motion.  Skin:    General: Skin is warm and dry.     Capillary Refill: Capillary refill takes less than 2 seconds.  Neurological:     General: No focal deficit present.     Mental Status: He is alert.  Psychiatric:        Mood and Affect: Mood normal.     ED Results / Procedures / Treatments   Labs (all labs ordered are listed, but only abnormal results are displayed) Labs Reviewed  CBC WITH DIFFERENTIAL/PLATELET - Abnormal; Notable for the following components:      Result Value   RBC 5.31 (*)    Hemoglobin 15.6 (*)    HCT 46.1 (*)    All other components within normal limits  BASIC METABOLIC PANEL - Abnormal; Notable for the following components:   Sodium 133 (*)    Glucose, Bld 105 (*)    Calcium 8.6 (*)    All other components within normal limits  HEPATIC FUNCTION PANEL  MAGNESIUM    EKG EKG Interpretation  Date/Time:  Wednesday December 03 2021 07:34:26 EST Ventricular Rate:  109 PR Interval:  152 QRS Duration: 85 QT Interval:  314 QTC Calculation: 423 R Axis:   71 Text Interpretation: Sinus tachycardia Low voltage, precordial leads Confirmed by Regan Lemming ([id number]) on 12/03/2021 10:31:50 AM  Radiology DG Chest 2 View  Result Date: 12/03/2021 CLINICAL DATA:  Shortness of breath, weakness, pneumonia EXAM: CHEST - 2 VIEW COMPARISON:  Chest radiograph 12/02/2021 FINDINGS: The cardiomediastinal silhouette is stable. Patchy opacities again seen in the left upper lobe, unchanged. Opacity in the lateral left base appears slightly worsened in the interim. The right lung is clear. There is no significant pleural effusion. There is no pneumothorax. There is no acute osseous abnormality. IMPRESSION: Multifocal opacities in the left lung concerning for pneumonia, slightly worsened in the left base since the study from 1 day prior. Electronically Signed   By: Valetta Mole M.D.   On: 12/03/2021 08:05   DG Chest 2 View  Result Date: 12/02/2021 CLINICAL DATA:  26 year old female with a history of cough EXAM: CHEST - 2 VIEW COMPARISON:  08/30/2007 FINDINGS: Cardiomediastinal silhouette within normal limits. Low lung volumes. Multifocal airspace disease of the left lung. No pneumothorax or pleural effusion. No displaced fracture.  IMPRESSION: Left-sided pneumonia Electronically Signed   By: Corrie Mckusick D.O.   On: 12/02/2021 12:55    Procedures Procedures    Medications Ordered in ED Medications  cefTRIAXone (ROCEPHIN) 1 g in sodium chloride 0.9 % 100 mL IVPB (0 g Intravenous Stopped 12/03/21 0844)  azithromycin (ZITHROMAX) 500 mg in sodium chloride 0.9 % 250 mL IVPB (0 mg Intravenous Stopped 12/03/21 1140)  lactated ringers bolus 1,000 mL (0 mLs Intravenous Stopped 12/03/21 1140)  ipratropium-albuterol (DUONEB) 0.5-2.5 (3) MG/3ML nebulizer solution 3 mL (3 mLs Nebulization Given 12/03/21 8101)    ED Course/ Medical Decision Making/ A&P  Medical Decision Making Amount and/or Complexity of Data Reviewed Labs: ordered. Radiology: ordered.  Risk Prescription drug management.   History:  Per HPI Social determinants of health: none  Initial impression:  This patient presents to the ED for concern of shortness of breath with known pneumonia diagnosis. This involves an extensive number of treatment options, and is a complaint that carries with it a high risk of complications and morbidity.    Patient is ill-appearing although nontoxic.  He has increased work of breathing.  Oxygen was at 92% on room air during my evaluation, steadily increasing up to about 95 to 96% after administration of his albuterol inhaler.  He is tachycardic but afebrile.Will obtain repeat chest x-ray, basic labs and administer IV Rocephin and azithromycin   Lab Tests and EKG:  I Ordered, reviewed, and interpreted labs and EKG.  The pertinent results include:  CBC without leukocytosis BMP unremarkable LFTs normal Magnesium normal   Imaging Studies ordered:  I ordered imaging studies including  Chest x-ray with worsening left lower lobe consolidation compared to x-ray yesterday I independently visualized and interpreted imaging and I agree with the radiologist interpretation.    Cardiac Monitoring:  The  patient was maintained on a cardiac monitor.  I personally viewed and interpreted the cardiac monitored which showed an underlying rhythm of: NSR   Medicines ordered and prescription drug management:  I ordered medication including: Rocephin 1 g IV Zithromax 500 mg IV LR 1 L bolus DuoNeb breathing treatment Reevaluation of the patient after these medicines showed that the patient improved I have reviewed the patients home medicines and have made adjustments as needed   ED Course: Patient's work-up was consistent with slightly worsening pneumonia.  This is likely because he was only taking one of the 2 recommended antibiotics.  He was given 2 doses of IV antibiotics here in the emergency department.  He is without leukocytosis and his vitals have been normal.  No oxygen requirement and maintaining oxygen at 95% plus on room air.  I considered admission, however given how reassuring his vitals and oxygenation was, I do not feel this is necessary at this time.  Will discharge home with outpatient therapy  Disposition:  After consideration of the diagnostic results, physical exam, history and the patients response to treatment feel that the patent would benefit from discharge with strict return precautions.   CAP left lower lobe: Two IV antibiotics administered today.  Patient is to continue taking his prescribed cefdinir and azithromycin prescribed by his PCP.  Return precautions were discussed.  All questions were asked and answered.  Patient is FTM and requests a prescription of Diflucan in case he develops a yeast infection after the numerous antibiotics prescribed.  Patient was discharged home in good condition   Final Clinical Impression(s) / ED Diagnoses Final diagnoses:  Community acquired pneumonia of left lower lobe of lung    Rx / DC Orders ED Discharge Orders          Ordered    fluconazole (DIFLUCAN) 200 MG tablet  Daily        12/03/21 1121              Tonye Pearson, [place] 12/03/21 1541    Regan Lemming, MD 12/03/21 [phone]

## 2021-12-11 ENCOUNTER — Encounter: Payer: Self-pay | Admitting: Internal Medicine

## 2021-12-17 ENCOUNTER — Other Ambulatory Visit: Payer: Self-pay | Admitting: Internal Medicine

## 2021-12-17 MED ORDER — ONDANSETRON HCL 4 MG PO TABS: 4.0000 mg | ORAL_TABLET | Freq: Three times a day (TID) | ORAL | 0 refills | Status: DC | PRN

## 2022-02-18 ENCOUNTER — Encounter: Payer: Self-pay | Admitting: Adult Health

## 2022-02-18 NOTE — Telephone Encounter (Signed)
Please get the patient scheduled for a MyChart visit or telephone visit to discuss other options. ?

## 2022-03-10 ENCOUNTER — Ambulatory Visit: Payer: 59 | Admitting: Adult Health

## 2022-03-10 DIAGNOSIS — G47411 Narcolepsy with cataplexy: Secondary | ICD-10-CM

## 2022-03-10 MED ORDER — SUNOSI 150 MG PO TABS: 150.0000 mg | ORAL_TABLET | Freq: Every day | ORAL | 3 refills | Status: DC

## 2022-03-10 NOTE — Progress Notes (Signed)
  [facility] [address] [place] [address] [phone]  PRIMARY NEUROLOGIST: Dr. Brett Fairy   Virtual Visit via Telephone Note  I connected with Shannon Clay on 03/10/22 at  2:30 PM EDT by telephone located remotely at [facility] and verified that I am speaking with the correct person using two identifiers who reports being located at home.    Visit scheduled byRNShe discussed the limitations, risks, security and privacy concerns of performing an evaluation and management service by telephone and the availability of in person appointments. I also discussed with the patient that there may be a patient responsible charge related to this service. The patient expressed understanding and agreed to proceed. See telephone note for consent and additional scheduling information.    History of Present Illness:  Shannon Clay is a 26 y.o. adult who has been followed in this office for narcolepsy.  He reports that he does not feel the medicine is working as well.  He is having a hard time staying awake during the day.  Feels like his head is heavy.  He has tried Provigil and Nuvigil in the past.  Currently on Sunosi 75 mg daily.  Currently being treated for depression.    Observations/Objective:  Generalized: Well developed, in no acute distress   Neurological examination  Mentation: Alert oriented to time, place, history taking. Follows all commands speech and language fluent  Assessment and Plan:  1: Narcolepsy  Increase Sunosi to 150 mg daily If this is not beneficial we can consider adding on Provigil or Nuvigil or switching to [employer]   Follow Up Instructions:   F/U in 1 month    I discussed the assessment and treatment plan with the patient.  The patient was provided an opportunity to ask questions and all were answered to their satisfaction. The patient agreed with the plan and verbalized an understanding of the instructions.   I  provided 5 minutes of non-face-to-face time during this encounter.    Ward Givens NP-C  [facility] [address] [place], [place] [address]  Phone [phone] Fax [phone] \

## 2022-03-26 ENCOUNTER — Ambulatory Visit: Payer: 59 | Admitting: Adult Health

## 2022-03-30 ENCOUNTER — Encounter: Payer: 59 | Admitting: Internal Medicine

## 2022-04-07 ENCOUNTER — Ambulatory Visit: Payer: 59 | Admitting: Adult Health

## 2022-04-23 ENCOUNTER — Encounter: Payer: Self-pay | Admitting: Adult Health

## 2022-04-23 ENCOUNTER — Ambulatory Visit: Payer: 59 | Admitting: Adult Health

## 2022-04-23 VITALS — BP 133/70 | HR 87 | Ht 64.0 in | Wt 187.0 lb

## 2022-04-23 DIAGNOSIS — G47411 Narcolepsy with cataplexy: Secondary | ICD-10-CM

## 2022-04-23 MED ORDER — MODAFINIL 100 MG PO TABS: 50.0000 mg | ORAL_TABLET | Freq: Every day | ORAL | 5 refills | Status: DC

## 2022-04-23 NOTE — Progress Notes (Signed)
PATIENT: Shannon Clay DOB: 02-04-1996  REASON FOR VISIT: follow up HISTORY FROM: patient PRIMARY NEUROLOGIST: Dr. Brett Fairy  Chief Complaint  Patient presents with   RM 4    Here alone for narcolepsy follow-up. Doing ok but feels things could be better. On Sunosi 150 mg daily. ESS 18.     HISTORY OF PRESENT ILLNESS: Today 04/23/22: Shannon Clay is a 26 year old. At the last visit sunosi increased. Sunosi has been beneficial but getting tired midday. May get burst of energy around 3-4 PM. Tried against once home from work. Could just go to sleep at that time. Has been on provigil in the past and it was helpful but didn't last all day.  HISTORY Shannon Clay is a 26 y.o. adult who has been followed in this office for narcolepsy.  He reports that he does not feel the medicine is working as well.  He is having a hard time staying awake during the day.  Feels like his head is heavy.  He has tried Provigil and Nuvigil in the past.  Currently on Sunosi 75 mg daily.  Currently being treated for depression.    REVIEW OF SYSTEMS: Out of a complete 14 system review of symptoms, the patient complains only of the following symptoms, and all other reviewed systems are negative.  ESS 18 FSS 53  ALLERGIES: Allergies  Allergen Reactions   Latex Swelling    Swelling    Sulfa Antibiotics Rash    HOME MEDICATIONS: Outpatient Medications Prior to Visit  Medication Sig Dispense Refill   acetaminophen (TYLENOL) 500 MG tablet Take 500 mg by mouth every 6 (six) hours as needed for mild pain.     albuterol (PROAIR HFA) 108 (90 Base) MCG/ACT inhaler Inhale 2 puffs into the lungs every 4 (four) hours as needed for wheezing or shortness of breath. 18 g 6   azithromycin (ZITHROMAX Z-PAK) 250 MG tablet As directed (Patient taking differently: Take 250 mg by mouth as directed. As directed) 6 tablet 0   buPROPion (WELLBUTRIN XL) 150 MG 24 hr tablet Take 150 mg by mouth every morning.     escitalopram  (LEXAPRO) 10 MG tablet Take 1 tablet by mouth daily.     ondansetron (ZOFRAN) 4 MG tablet Take 1 tablet (4 mg total) by mouth every 8 (eight) hours as needed for nausea or vomiting. 20 tablet 0   Solriamfetol HCl (SUNOSI) 150 MG TABS Take 150 mg by mouth daily. 30 tablet 3   testosterone cypionate (DEPOTESTOSTERONE CYPIONATE) 200 MG/ML injection INJECT 0.5 MLS (100 MG TOTAL) INTO THE MUSCLE EVERY 14 (FOURTEEN) DAYS. 3 mL 1   HYDROcodone bit-homatropine (HYCODAN) 5-1.5 MG/5ML syrup Take 5 mLs by mouth every 8 (eight) hours as needed for cough. (Patient not taking: Reported on 04/23/2022) 120 mL 0   cefdinir (OMNICEF) 300 MG capsule Take 1 capsule (300 mg total) by mouth 2 (two) times daily. (Patient not taking: Reported on 04/23/2022) 20 capsule 0   citalopram (CELEXA) 40 MG tablet Take 1 tablet (40 mg total) by mouth daily. 90 tablet 3   No facility-administered medications prior to visit.    PAST MEDICAL HISTORY: Past Medical History:  Diagnosis Date   ADHD (attention deficit hyperactivity disorder)    Asthma    Depression    Frequent headaches    Migraine     PAST SURGICAL HISTORY: History reviewed. No pertinent surgical history.  FAMILY HISTORY: Family History  Problem Relation Age of Onset   ADD /  ADHD Mother    Anxiety disorder Mother    Mental illness Mother    OCD Father    Anxiety disorder Sister    Depression Sister    Narcolepsy Maternal Aunt    Depression Paternal Aunt    Arthritis Maternal Grandmother     SOCIAL HISTORY: Social History   Socioeconomic History   Marital status: Single    Spouse name: Not on file   Number of children: Not on file   Years of education: Not on file   Highest education level: Not on file  Occupational History   Not on file  Tobacco Use   Smoking status: Never   Smokeless tobacco: Never  Vaping Use   Vaping Use: Former  Substance and Sexual Activity   Alcohol use: Not Currently    Comment: social   Drug use: No   Sexual  activity: Never  Other Topics Concern   Not on file  Social History Narrative   Lives with parents   Right handed   Caffeine: 1 energy drink per day   Social Determinants of Health   Financial Resource Strain: Not on file  Food Insecurity: Not on file  Transportation Needs: Not on file  Physical Activity: Not on file  Stress: Not on file  Social Connections: Not on file  Intimate Partner Violence: Not on file      PHYSICAL EXAM  Vitals:   04/23/22 1338  BP: 133/70  Pulse: 87  Weight: 187 lb (84.8 kg)  Height: '5\' 4"'$  (1.626 m)   Body mass index is 32.1 kg/m.  Generalized: Well developed, in no acute distress   Neurological examination  Mentation: Alert oriented to time, place, history taking. Follows all commands speech and language fluent Cranial nerve II-XII: Pupils were equal round reactive to light. Extraocular movements were full, visual field were full on confrontational test. Facial sensation and strength were normal.  Head turning and shoulder shrug  were normal and symmetric. Motor: The motor testing reveals 5 over 5 strength of all 4 extremities. Good symmetric motor tone is noted throughout.  Sensory: Sensory testing is intact to soft touch on all 4 extremities. No evidence of extinction is noted.  Coordination: Cerebellar testing reveals good finger-nose-finger and heel-to-shin bilaterally.  Gait and station: Gait is normal. Tandem gait is normal. Romberg is negative. No drift is seen.    DIAGNOSTIC DATA (LABS, IMAGING, TESTING) - I reviewed patient records, labs, notes, testing and imaging myself where available.  Lab Results  Component Value Date   WBC 5.9 12/03/2021   HGB 15.6 (H) 12/03/2021   HCT 46.1 (H) 12/03/2021   MCV 86.8 12/03/2021   PLT 212 12/03/2021      Component Value Date/Time   NA 133 (L) 12/03/2021 0813   NA 143 11/25/2017 0921   K 3.6 12/03/2021 0813   CL 102 12/03/2021 0813   CO2 24 12/03/2021 0813   GLUCOSE 105 (H)  12/03/2021 0813   BUN 9 12/03/2021 0813   BUN 13 11/25/2017 0921   CREATININE 0.87 12/03/2021 0813   CALCIUM 8.6 (L) 12/03/2021 0813   PROT 7.1 12/03/2021 0813   PROT 6.3 11/25/2017 0921   ALBUMIN 4.3 12/03/2021 0813   ALBUMIN 4.5 11/25/2017 0921   AST 22 12/03/2021 0813   ALT 24 12/03/2021 0813   ALKPHOS 58 12/03/2021 0813   BILITOT 0.5 12/03/2021 0813   BILITOT 0.5 11/25/2017 0921   GFRNONAA >60 12/03/2021 0813   GFRAA 125 11/25/2017 2409  Lab Results  Component Value Date   CHOL 223 (H) 09/23/2020   HDL 41.80 09/23/2020   LDLCALC 97 02/18/2017   LDLDIRECT 145.0 09/23/2020   TRIG 241.0 (H) 09/23/2020   CHOLHDL 5 09/23/2020   No results found for: "HGBA1C" No results found for: "VITAMINB12" Lab Results  Component Value Date   TSH 2.21 02/18/2017      ASSESSMENT AND PLAN 26 y.o. year old adult  has a past medical history of ADHD (attention deficit hyperactivity disorder), Asthma, Depression, Frequent headaches, and Migraine. here with:   Narcolepsy  Continue Sunosi 150 mg at bedtime We will add on Provigil 50 mg to be taken around 11-12 AM.  If this is not beneficial we potentially could increase Provigil to 100 mg Advised to monitor heart rate and blood pressure Follow-up in 6 months or sooner if needed     Ward Givens, MSN, NP-C 04/23/2022, 1:49 PM [facility] [address], [place], [place] [address] [phone]

## 2022-04-23 NOTE — Patient Instructions (Signed)
Your Plan:  Continue sunosi Add provigil 50 mg mid morning if needed  Monitor heartrate and blood pressure If your symptoms worsen or you develop new symptoms please let us know.    Thank you for coming to see [place] at [facility]. I hope we have been able to provide you high quality care today.  You may receive a patient satisfaction survey over the next few weeks. We would appreciate your feedback and comments so that we may continue to improve ourselves and the health of our patients.

## 2022-04-27 ENCOUNTER — Telehealth: Payer: Self-pay

## 2022-04-27 ENCOUNTER — Encounter: Payer: 59 | Admitting: Internal Medicine

## 2022-04-27 NOTE — Telephone Encounter (Signed)
PA for Modafinil has been started through [facility] (Key: [id number])  Your demographic data has been sent to [place] successfully!  [employer] typically takes 5-10 minutes to respond, but it may take a little longer in some cases. You will be notified by email when available. You can also check for an update later by opening this request from your dashboard. Please do not fax or call [employer] to resubmit this request. If you need assistance, please chat with [employer]eds or call us at [phone].  If it has been longer than 24 hours, please reach out to [place].  Will update once clinical questions have been received.

## 2022-04-28 NOTE — Telephone Encounter (Signed)
PA for Modafinil has been sent  Your information has been submitted to [facility]. To check for an updated outcome later, reopen this PA request from your dashboard.  If [employer] has not responded to your request within 24 hours, contact [place] at [phone]. If you think there may be a problem with your PA request, use our live chat feature at the bottom right.

## 2022-04-29 NOTE — Telephone Encounter (Signed)
Received fax from [facility] [employer]: modafinil approved 04/29/22 - 04/30/23, approval letter faxed to pharmacy.

## 2022-07-08 ENCOUNTER — Ambulatory Visit: Payer: 59 | Admitting: Adult Health

## 2022-09-11 ENCOUNTER — Other Ambulatory Visit: Payer: Self-pay | Admitting: Adult Health

## 2022-09-15 NOTE — Telephone Encounter (Signed)
Last appt 04/23/22 Next appt 11/19/22 Per [place] registry, last filled on 07/22/22 #30/30. Rx refill sent to [facility].

## 2022-09-22 ENCOUNTER — Ambulatory Visit (INDEPENDENT_AMBULATORY_CARE_PROVIDER_SITE_OTHER): Payer: 59

## 2022-09-22 ENCOUNTER — Ambulatory Visit: Payer: 59 | Admitting: Nurse Practitioner

## 2022-09-22 ENCOUNTER — Encounter: Payer: Self-pay | Admitting: Nurse Practitioner

## 2022-09-22 VITALS — BP 108/78 | HR 94 | Temp 98.6°F | Ht 64.0 in | Wt 182.0 lb

## 2022-09-22 DIAGNOSIS — M25571 Pain in right ankle and joints of right foot: Secondary | ICD-10-CM | POA: Diagnosis not present

## 2022-09-22 DIAGNOSIS — M7989 Other specified soft tissue disorders: Secondary | ICD-10-CM | POA: Diagnosis not present

## 2022-09-22 MED ORDER — IBUPROFEN 600 MG PO TABS: 600.0000 mg | ORAL_TABLET | Freq: Three times a day (TID) | ORAL | 0 refills | Status: AC | PRN

## 2022-09-22 NOTE — Patient Instructions (Addendum)
Do not exceed '3000mg'$  of tylenol in 24 hours period. Do not exceed '3200mg'$  of ibuprofen in a 24 hour period.   Do not mix ibuprofen with any other NSAIDs or aspirin.

## 2022-09-22 NOTE — Progress Notes (Signed)
   Established Patient Office Visit  Subjective   Patient ID: Shannon Clay, adult    DOB: May 01, 1996  Age: 26 y.o. MRN: [id number]  Chief Complaint  Patient presents with   Ankle Injury    Ankle rolled and heard a snap. Cannot put any weight on it.    Symptoms started last night when patient stepped off his porch and right ankle rolled inward.  Patient started experiencing severe pain immediately, heard a pop, and was unable to bear weight.  Patient arrives today in wheelchair as he is still unable to bear weight on his right ankle.  Has been taking Tylenol as needed for pain management.  Is able to feel ankle and move toes, experiences pain when flexing or extending ankle.  Reports distant history of sprain to the right ankle in the past.    ROS: see HPI    Objective:     BP 108/78 (BP Location: Left Arm, Patient Position: Sitting, Cuff Size: Normal)   Pulse 94   Temp 98.6 F (37 C) (Oral)   Ht '5\' 4"'$  (1.626 m)   Wt 182 lb (82.6 kg)   BMI 31.24 kg/m    Physical Exam Constitutional:      Appearance: Normal appearance. He is normal weight.  HENT:     Head: Normocephalic.  Cardiovascular:     Rate and Rhythm: Normal rate.  Pulmonary:     Effort: Pulmonary effort is normal.  Musculoskeletal:     Right ankle: Swelling present. No ecchymosis. Tenderness present over the lateral malleolus. Decreased range of motion. Normal pulse.     Left ankle: Normal.  Neurological:     Mental Status: He is alert.      No results found for any visits on 09/22/22.    The ASCVD Risk score (Arnett DK, et al., 2019) failed to calculate for the following reasons:   The 2019 ASCVD risk score is only valid for ages 34 to 79    Assessment & Plan:   Problem List Items Addressed This Visit       Other   Acute right ankle pain - Primary    Acute, concern for fracture.  Will order stat x-ray of right ankle.  Recommend pain management with rest, ice, elevation, and compression.  Patient  can also take Tylenol as well as ibuprofen as needed for pain management.  Patient educated to use ice for 20 minutes on and 20 minutes off.  Urgent referral to orthopedics also made today for assistance with managing ankle pain.  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       Relevant Medications   ibuprofen (ADVIL) 600 MG tablet   Other Relevant Orders   DG Ankle 2 Views Right   DME Crutches   Ambulatory referral to Orthopedic Surgery    Return if symptoms worsen or fail to improve.    Ailene Ards, NP

## 2022-09-22 NOTE — Assessment & Plan Note (Signed)
Acute, concern for fracture.  Will order stat x-ray of right ankle.  Recommend pain management with rest, ice, elevation, and compression.  Patient can also take Tylenol as well as ibuprofen as needed for pain management.  Patient educated to use ice for 20 minutes on and 20 minutes off.  Urgent referral to orthopedics also made today for assistance with managing ankle pain.  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

## 2022-09-24 ENCOUNTER — Ambulatory Visit: Payer: Self-pay

## 2022-09-24 ENCOUNTER — Ambulatory Visit: Payer: 59 | Admitting: Sports Medicine

## 2022-09-24 ENCOUNTER — Telehealth: Payer: Self-pay | Admitting: Radiology

## 2022-09-24 ENCOUNTER — Encounter: Payer: Self-pay | Admitting: Sports Medicine

## 2022-09-24 VITALS — Ht 62.0 in | Wt 180.0 lb

## 2022-09-24 DIAGNOSIS — M25571 Pain in right ankle and joints of right foot: Secondary | ICD-10-CM | POA: Diagnosis not present

## 2022-09-24 DIAGNOSIS — S93491A Sprain of other ligament of right ankle, initial encounter: Secondary | ICD-10-CM

## 2022-09-24 NOTE — Progress Notes (Signed)
Shannon Clay - 26 y.o. adult MRN [id number]  Date of birth: Oct 05, 1996  Office Visit Note: Visit Date: 09/24/2022 PCP: Shannon Koch, MD Referred by: Shannon Ards, NP  Subjective: Chief Complaint  Patient presents with   Right Ankle - Pain   HPI: Shannon Clay "Shannon Clay" is a pleasant 26 y.o. adult who presents today for right ankle injury.  On Monday night he went to step off of his porch about 2 feet in height and suffered an inversion ankle injury.  He started having immediate pain and did feel a pop on the lateral aspect of the ankle.  At difficulty putting weight down at that time.  He was seen by his primary care physician and had x-rays which did not reveal any evidence of fracture.  Patient does state that he had an ankle injury similar to this about 2 years ago.  He has noticed swelling and bruising over the lateral ankle.  His pain is slightly improved, although his PCP told him to not put any weight down on the ankle.  He is taking ibuprofen and icing for pain control.  Pertinent ROS were reviewed with the patient and found to be negative unless otherwise specified above in HPI.   Assessment & Plan: Visit Diagnoses:  1. Pain in right ankle and joints of right foot   2. Sprain of anterior talofibular ligament of right ankle, initial encounter    Plan: Discussed with Shannon Clay today that based on her exam and ultrasound, he does have a tear of the ATFL ligament.  Reviewed his initial x-rays from her PCP which did not show any evidence of acute fracture.  He does have a bit of swelling over the lateral ankle and dorsal midfoot.  Would like him to continue RICE therapy, recommend ibuprofen once/twice daily for the remainder of this week, then as needed.  I would like 26 to start weightbearing as tolerated to help mobilize the fluid.  I did print out and demonstrate set up ankle rehab exercises I would like medicine to begin starting next Monday.  He will weight-bear with  crutches as tolerated.  At work he may wear a cam walker boot for protection, but do not want to immobilize this for long periods of time.  I would like to see how Shannon Clay does in 2 weeks, if he is not at least 50% improved at that time he will present for reevaluation.  Otherwise may continue supportive care and ankle rehab.  Did recommend that he obtain a lace up ankle brace for support.  Follow-up: Return in about 2 weeks (around 10/08/2022) for if symptoms not > 50% improved.   Meds & Orders: No orders of the defined types were placed in this encounter.   Orders Placed This Encounter  Procedures   [place] Extrem Low Left Ltd   [place] Extrem Low Right Ltd     Procedures: No procedures performed      Clinical History: No specialty comments available.  He reports that he has never smoked. He has never used smokeless tobacco. No results for input(s): "HGBA1C", "LABURIC" in the last 8760 hours.  Objective:   Vital Signs: Ht '5\' 2"'$  (1.575 m)   Wt 180 lb (81.6 kg)   LMP 08/26/2022 (Approximate)   BMI 32.92 kg/m   Physical Exam  Gen: Well-appearing, in no acute distress; non-toxic CV: Regular Rate. Well-perfused. Warm.  Resp: Breathing unlabored on room air; no wheezing. Psych: Fluid speech in conversation; appropriate affect;  normal thought process Neuro: Sensation intact throughout. No gross coordination deficits.   Ortho Exam - Right ankle/foot: Evaluation of the right ankle and foot shows significant soft tissue swelling over the lateral ankle and anterior midfoot.  There is no bony TTP over the lateral malleolus or medial malleolus.  No base of the fifth TTP or navicular TTP. + TTP over the ATFL region and over the generalized dorsum of the midfoot.  There is some laxity with anterior drawer.  Pain with resisted inversion/eversion.  Patient walks with a guarded gait, able to toe down with crutches.  Neurovascularly intact.  Imaging: [place] Extrem Low Right Ltd  Result Date:  09/24/2022 Limited musculoskeletal ultrasound of the right ankle was performed.  Lateral malleolus evaluated without cortical regularity.  Peroneal tendons were visualized without evidence of gross tearing in short and long axis.  Evaluation of the ATFL does show a high-grade to full-thickness tear.  AITFL ligament intact.  There is some soft tissue swelling over the midfoot and lateral ankle.     *Independent review of 2 view right ankle x-ray from 09/22/2022 shows no acute bony fracture.  There is soft tissue swelling over the lateral and anterior aspect of the ankle and midfoot.  Ankle mortise is intact and symmetric.  [place] Extrem Low Right Ltd Limited musculoskeletal ultrasound of the right ankle was performed.   Lateral malleolus evaluated without cortical regularity.  Peroneal tendons  were visualized without evidence of gross tearing in short and long axis.   Evaluation of the ATFL does show a high-grade to full-thickness tear.   AITFL ligament intact.  There is some soft tissue swelling over the  midfoot and lateral ankle.    Past Medical/Family/Surgical/Social History: Medications & Allergies reviewed per EMR, new medications updated. 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   Sleep-related hallucinations 05/17/2017   Excessive daytime sleepiness 05/17/2017   Asthma 02/18/2017   Severe episode of recurrent major depressive disorder, without psychotic features ([place]) 02/18/2017   ADHD (attention deficit hyperactivity disorder) 10/19/2011   GAD (generalized anxiety disorder) 10/19/2011   Past Medical History:  Diagnosis Date   ADHD (attention deficit hyperactivity  disorder)    Asthma    Depression    Frequent headaches    Migraine    Family History  Problem Relation Age of Onset   ADD / ADHD Mother    Anxiety disorder Mother    Mental illness Mother    OCD Father    Anxiety disorder Sister    Depression Sister    Narcolepsy Maternal Aunt    Depression Paternal Aunt    Arthritis Maternal Grandmother    History reviewed. No pertinent surgical history. Social History   Occupational History   Not on file  Tobacco Use   Smoking status: Never   Smokeless tobacco: Never  Vaping Use   Vaping Use: Former  Substance and Sexual Activity   Alcohol use: Not Currently    Comment: social   Drug use: No   Sexual activity: Never

## 2022-09-24 NOTE — Telephone Encounter (Signed)
-----   Message from Ailene Ards, NP sent at 09/22/2022 10:40 AM EST ----- Please call patient notify him that no fracture is seen on x-ray however soft tissue swelling is noted.  I still recommend following up with orthopedics once they call for further evaluation.  I also recommend he continue to rest the ankle and not bear weight on it for the next few days, use ice as needed, use compression wrap, elevate, take Tylenol and or ibuprofen as well as needed for pain management.  Please let me know if you have any questions.

## 2022-09-24 NOTE — Telephone Encounter (Signed)
LVM for pt to call clinic for xray results.

## 2022-10-26 ENCOUNTER — Ambulatory Visit (INDEPENDENT_AMBULATORY_CARE_PROVIDER_SITE_OTHER): Payer: 59 | Admitting: Internal Medicine

## 2022-10-26 ENCOUNTER — Encounter: Payer: Self-pay | Admitting: Internal Medicine

## 2022-10-26 VITALS — BP 120/80 | HR 90 | Temp 99.2°F | Ht 62.0 in | Wt 187.0 lb

## 2022-10-26 DIAGNOSIS — J4520 Mild intermittent asthma, uncomplicated: Secondary | ICD-10-CM | POA: Diagnosis not present

## 2022-10-26 DIAGNOSIS — G4719 Other hypersomnia: Secondary | ICD-10-CM | POA: Diagnosis not present

## 2022-10-26 DIAGNOSIS — Z0000 Encounter for general adult medical examination without abnormal findings: Secondary | ICD-10-CM | POA: Diagnosis not present

## 2022-10-26 DIAGNOSIS — Z23 Encounter for immunization: Secondary | ICD-10-CM | POA: Diagnosis not present

## 2022-10-26 DIAGNOSIS — E349 Endocrine disorder, unspecified: Secondary | ICD-10-CM | POA: Diagnosis not present

## 2022-10-26 MED ORDER — BD HYPODERMIC NEEDLE 16G X 1" MISC
0 refills | Status: AC
Start: 2022-10-26 — End: ?

## 2022-10-26 MED ORDER — TESTOSTERONE CYPIONATE 100 MG/ML IM SOLN: 100.0000 mg | INTRAMUSCULAR | 0 refills | Status: DC

## 2022-10-26 NOTE — Progress Notes (Signed)
   Subjective:   Patient ID: Shannon Clay, adult    DOB: March 20, 1996, 27 y.o.   MRN: [id number]  HPI The patient is here for physical.  PMH, [facility], social history reviewed and updated  Review of Systems  Constitutional: Negative.   HENT: Negative.    Eyes: Negative.   Respiratory:  Negative for cough, chest tightness and shortness of breath.   Cardiovascular:  Negative for chest pain, palpitations and leg swelling.  Gastrointestinal:  Negative for abdominal distention, abdominal pain, constipation, diarrhea, nausea and vomiting.  Musculoskeletal: Negative.   Skin: Negative.   Neurological: Negative.   Psychiatric/Behavioral: Negative.      Objective:  Physical Exam Constitutional:      Appearance: He is well-developed.  HENT:     Head: Normocephalic and atraumatic.  Cardiovascular:     Rate and Rhythm: Normal rate and regular rhythm.  Pulmonary:     Effort: Pulmonary effort is normal. No respiratory distress.     Breath sounds: Normal breath sounds. No wheezing or rales.  Abdominal:     General: Bowel sounds are normal. There is no distension.     Palpations: Abdomen is soft.     Tenderness: There is no abdominal tenderness. There is no rebound.  Musculoskeletal:     Cervical back: Normal range of motion.  Skin:    General: Skin is warm and dry.  Neurological:     Mental Status: He is alert and oriented to person, place, and time.     Coordination: Coordination normal.     Vitals:   10/26/22 0933  BP: 120/80  Pulse: 90  Temp: 99.2 F (37.3 C)  TempSrc: Oral  SpO2: 97%  Weight: 187 lb (84.8 kg)  Height: '5\' 2"'$  (1.575 m)    Assessment & Plan:  Flu shot given at visit

## 2022-10-26 NOTE — Assessment & Plan Note (Signed)
Restart testosterone 100 mg every 2 weeks IM rx done today for medicine and needles. Checking CBC, CMP, testosterone in 1-2 months which is ordered today.

## 2022-10-26 NOTE — Assessment & Plan Note (Signed)
No flare today and non-smoker.

## 2022-10-26 NOTE — Patient Instructions (Signed)
We have sent in the testosterone to do 100 mg every 2 weeks. Come back for labs about 1-2 months after starting so we can adjust the dose if needed.

## 2022-10-26 NOTE — Assessment & Plan Note (Signed)
Flu shot given. Covid-19 counseled. Tetanus up to date. Counseled about sun safety and mole surveillance. Counseled about the dangers of distracted driving. Given 10 year screening recommendations.

## 2022-10-30 ENCOUNTER — Encounter: Payer: Self-pay | Admitting: Internal Medicine

## 2022-10-30 MED ORDER — TESTOSTERONE CYPIONATE 200 MG/ML IM SOLN: 100.0000 mg | INTRAMUSCULAR | 1 refills | Status: DC

## 2022-10-30 NOTE — Telephone Encounter (Signed)
Spoke with the pharmacy and the pharmacist has stated they only have the '200mg'$  per ML of the testosterone. She has stated she can fill the med at that dose if provider agrees and sends in a new rx for the '200mg'$  per ML dose.

## 2022-11-19 ENCOUNTER — Encounter: Payer: Self-pay | Admitting: Adult Health

## 2022-11-19 ENCOUNTER — Ambulatory Visit: Payer: 59 | Admitting: Adult Health

## 2022-11-19 VITALS — BP 121/66 | HR 80 | Ht 65.0 in | Wt 189.4 lb

## 2022-11-19 DIAGNOSIS — G47411 Narcolepsy with cataplexy: Secondary | ICD-10-CM | POA: Diagnosis not present

## 2022-11-19 MED ORDER — SUNOSI 150 MG PO TABS: 150.0000 mg | ORAL_TABLET | Freq: Every day | ORAL | 2 refills | Status: AC

## 2022-11-19 MED ORDER — MODAFINIL 100 MG PO TABS: 100.0000 mg | ORAL_TABLET | Freq: Every day | ORAL | 5 refills | Status: DC

## 2022-11-19 NOTE — Patient Instructions (Signed)
Your Plan: ? ?Continue ? ? ? ? ?Thank you for coming to see us at [facility]. I hope we have been able to provide you high quality care today. ? ?You may receive a patient satisfaction survey over the next few weeks. We would appreciate your feedback and comments so that we may continue to improve ourselves and the health of our patients. ? ?

## 2022-11-19 NOTE — Progress Notes (Signed)
PATIENT: Shannon Clay DOB: 04-Sep-1996  REASON FOR VISIT: follow up Shannon FROM: patient PRIMARY NEUROLOGIST: Dr. Brett Fairy  Chief Complaint  Patient presents with   Follow-up    Pt in 4  Pt here for narcolepsy f/u Pt states there has been some episodes he has fallen asleep      Shannon OF PRESENT ILLNESS: Today 11/19/22:  Shannon Clay is a 27 y.o. female with a Shannon of narcolepsy. Returns today for follow-up.  Remains on Sunosi 150 mg daily. at the last visit we added on Provigil.  Reports that the addition has not made much of a difference. Still feeling sleepy throughout the day. Able to work but reports that he thinks about going to bed most of the day.  He states that he has had some cataplectic events in which his head will nod but this is rare.   04/23/22:Shannon Clay is a 27 year old. At the last visit sunosi increased. Sunosi has been beneficial but getting tired midday. May get burst of energy around 3-4 PM. Tried against once home from work. Could just go to sleep at that time. Has been on provigil in the past and it was helpful but didn't last all day.  Shannon Clay is a 27 y.o. adult who has been followed in this office for narcolepsy.  He reports that he does not feel the medicine is working as well.  He is having a hard time staying awake during the day.  Feels like his head is heavy.  He has tried Provigil and Nuvigil in the past.  Currently on Sunosi 75 mg daily.  Currently being treated for depression.    REVIEW OF SYSTEMS: Out of a complete 14 system review of symptoms, the patient complains only of the following symptoms, and all other reviewed systems are negative.   ALLERGIES: Allergies  Allergen Reactions   Latex Swelling    Swelling    Sulfa Antibiotics Rash    HOME MEDICATIONS: Outpatient Medications Prior to Visit  Medication Sig Dispense Refill   acetaminophen (TYLENOL) 500 MG tablet Take 500 mg by mouth every 6 (six) hours as needed for  mild pain.     albuterol (PROAIR HFA) 108 (90 Base) MCG/ACT inhaler Inhale 2 puffs into the lungs every 4 (four) hours as needed for wheezing or shortness of breath. 18 g 6   ibuprofen (ADVIL) 600 MG tablet Take 1 tablet (600 mg total) by mouth every 8 (eight) hours as needed. (Patient taking differently: Take 600 mg by mouth as needed.) 30 tablet 0   modafinil (PROVIGIL) 100 MG tablet Take 0.5 tablets (50 mg total) by mouth daily. 30 tablet 5   NEEDLE, DISP, 16 G (BD HYPODERMIC NEEDLE) 16G X 1" MISC Use with testosterone every 2 weeks 50 each 0   SUNOSI 150 MG TABS TAKE 150 MG BY MOUTH DAILY. 30 tablet 2   testosterone cypionate (DEPO-TESTOSTERONE) 200 MG/ML injection Inject 0.5 mLs (100 mg total) into the muscle every 14 (fourteen) days. 3 mL 1   No facility-administered medications prior to visit.    PAST MEDICAL Shannon: Past Medical Shannon:  Diagnosis Date   ADHD (attention deficit hyperactivity disorder)    Asthma    Depression    Frequent headaches    Migraine     PAST SURGICAL Shannon: Shannon reviewed. No pertinent surgical Shannon.  FAMILY Shannon: Family Shannon  Problem Relation Age of Onset   ADD / ADHD Mother    Anxiety  disorder Mother    Mental illness Mother    OCD Father    Anxiety disorder Sister    Depression Sister    Narcolepsy Maternal Aunt    Depression Paternal Aunt    Arthritis Maternal Grandmother     SOCIAL Shannon: Social Shannon   Socioeconomic Shannon   Marital status: Single    Spouse name: Not on file   Number of children: Not on file   Years of education: Not on file   Highest education level: Not on file  Occupational Shannon   Not on file  Tobacco Use   Smoking status: Never   Smokeless tobacco: Never  Vaping Use   Vaping Use: Former  Substance and Sexual Activity   Alcohol use: Not Currently    Comment: social   Drug use: No   Sexual activity: Never  Other Topics Concern   Not on file  Social Shannon Narrative   Lives  with parents   Right handed   Caffeine: 1 energy drink per day   Social Determinants of Health   Financial Resource Strain: Not on file  Food Insecurity: Not on file  Transportation Needs: Not on file  Physical Activity: Not on file  Stress: Not on file  Social Connections: Not on file  Intimate Partner Violence: Not on file      PHYSICAL EXAM  Vitals:   11/19/22 1356  BP: 121/66  Pulse: 80  Weight: 189 lb 6.4 oz (85.9 kg)  Height: '5\' 5"'$  (1.651 m)    Body mass index is 31.52 kg/m.  Generalized: Well developed, in no acute distress   Neurological examination  Mentation: Alert oriented to time, place, Shannon taking. Follows all commands speech and language fluent Cranial nerve II-XII: Pupils were equal round reactive to light. Extraocular movements were full, visual field were full on confrontational test. Facial sensation and strength were normal.  Head turning and shoulder shrug  were normal and symmetric. Motor: The motor testing reveals 5 over 5 strength of all 4 extremities. Good symmetric motor tone is noted throughout.  Sensory: Sensory testing is intact to soft touch on all 4 extremities. No evidence of extinction is noted.  Coordination: Cerebellar testing reveals good finger-nose-finger and heel-to-shin bilaterally.  Gait and station: Gait is normal.    DIAGNOSTIC DATA (LABS, IMAGING, TESTING) - I reviewed patient records, labs, notes, testing and imaging myself where available.  Lab Results  Component Value Date   WBC 5.9 12/03/2021   HGB 15.6 (H) 12/03/2021   HCT 46.1 (H) 12/03/2021   MCV 86.8 12/03/2021   PLT 212 12/03/2021      Component Value Date/Time   NA 133 (L) 12/03/2021 0813   NA 143 11/25/2017 0921   K 3.6 12/03/2021 0813   CL 102 12/03/2021 0813   CO2 24 12/03/2021 0813   GLUCOSE 105 (H) 12/03/2021 0813   BUN 9 12/03/2021 0813   BUN 13 11/25/2017 0921   CREATININE 0.87 12/03/2021 0813   CALCIUM 8.6 (L) 12/03/2021 0813   PROT 7.1  12/03/2021 0813   PROT 6.3 11/25/2017 0921   ALBUMIN 4.3 12/03/2021 0813   ALBUMIN 4.5 11/25/2017 0921   AST 22 12/03/2021 0813   ALT 24 12/03/2021 0813   ALKPHOS 58 12/03/2021 0813   BILITOT 0.5 12/03/2021 0813   BILITOT 0.5 11/25/2017 0921   GFRNONAA >60 12/03/2021 0813   GFRAA 125 11/25/2017 0921   Lab Results  Component Value Date   CHOL 223 (H) 09/23/2020  HDL 41.80 09/23/2020   LDLCALC 97 02/18/2017   LDLDIRECT 145.0 09/23/2020   TRIG 241.0 (H) 09/23/2020   CHOLHDL 5 09/23/2020    Lab Results  Component Value Date   TSH 2.21 02/18/2017      ASSESSMENT AND PLAN 27 y.o. year old adult  has a past medical Shannon of ADHD (attention deficit hyperactivity disorder), Asthma, Depression, Frequent headaches, and Migraine. here with:   Narcolepsy  Continue Sunosi 150 mg at bedtime Increase Provigil to 100 mg to be taken around 11-12 PM. If the increase in Provigil is not helpful may consider switching over to [facility] Follow-up in 6 months or sooner if needed     Ward Givens, MSN, NP-C 11/19/2022, 1:57 PM [facility] [address], [place], [place] [address] [phone]

## 2022-11-23 ENCOUNTER — Telehealth: Payer: Self-pay | Admitting: *Deleted

## 2022-11-25 ENCOUNTER — Encounter: Payer: Self-pay | Admitting: Adult Health

## 2022-12-01 NOTE — Telephone Encounter (Signed)
Patient Advocate Encounter  Prior Authorization for Sunosi 150MG tablets has been approved.    Key: Clide Cliff, CPhT Pharmacy Patient Advocate Specialist [facility] Patient Advocate Team Direct Number: [phone]  Fax: [phone]

## 2022-12-03 NOTE — Telephone Encounter (Signed)
Did you all do form for [employer]?

## 2022-12-08 NOTE — Telephone Encounter (Signed)
Received fax from [facility] stating enrollment form has been received. They will conduct a benefits investigation and will fax any necessary forms to our office. For any questions, call [phone].

## 2022-12-08 NOTE — Telephone Encounter (Signed)
[employer] form

## 2022-12-09 NOTE — Telephone Encounter (Addendum)
(  Key: [id number])akix 17.'8mg'$  tabs. CVCM.

## 2022-12-17 NOTE — Telephone Encounter (Signed)
[employer] approved PA# [id number] BR for [employer].  Faxed approval to [phone].  ([employer] support program). Fax confirmation received.

## 2022-12-24 NOTE — Telephone Encounter (Addendum)
Received fax from [facility] stating the prescription has been sent to [facility] for processing and dispensing. They will reach out if any further information is needed. [employer]'s number is [phone] if there are any questions.   Patient ID for program is [id number]

## 2022-12-24 NOTE — Telephone Encounter (Signed)
Received [employer] PA to complete for 4.45 mg tablets. I completed this on CMM. Key: [id number]. Awaiting determination from [employer].

## 2023-02-21 IMAGING — DX DG TOE 4TH 2+V*R*
3 series · 3 of 3 positions shown · non-contrast
Comparison: None.

CLINICAL DATA: Right fourth toe pain and swelling since the patient
struck toe on refrigerator 1-2 weeks ago. Initial encounter.

EXAM:
RIGHT FOURTH TOE

[toe ap]
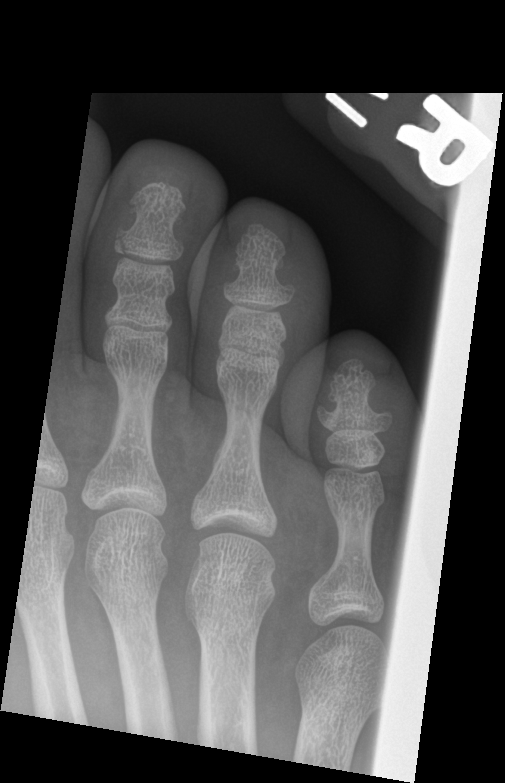

[toe obl]
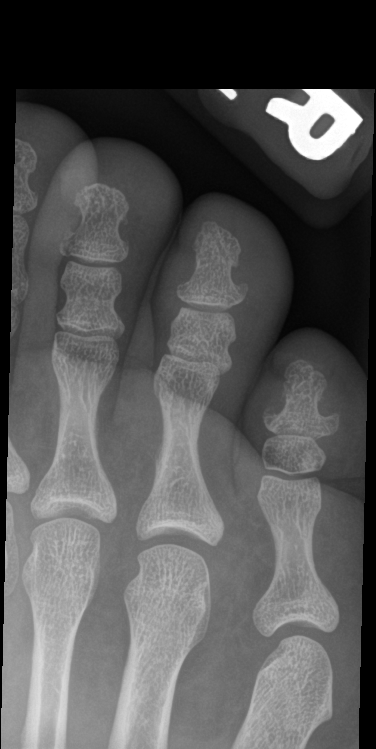

[toe lat]
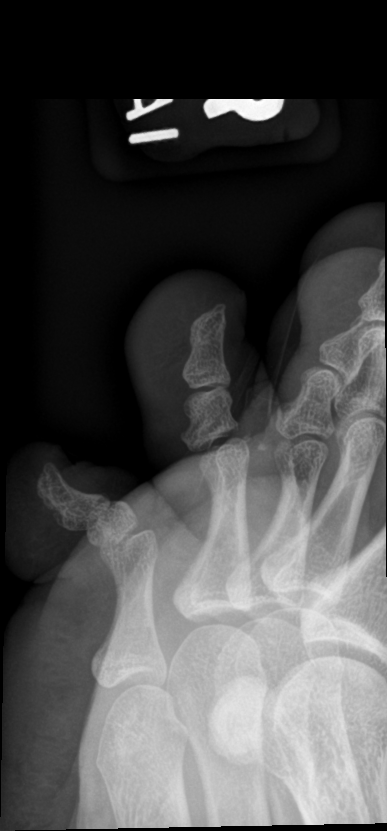

[3 of 3 positions shown; findings below may reference images not displayed]

FINDINGS: There is no evidence of fracture or dislocation. There is no
evidence of arthropathy or other focal bone abnormality. Soft
tissues are unremarkable.
IMPRESSION: Normal exam.

## 2023-04-26 ENCOUNTER — Ambulatory Visit: Payer: 59 | Admitting: Internal Medicine

## 2023-04-27 ENCOUNTER — Telehealth: Payer: Self-pay | Admitting: Adult Health

## 2023-04-27 NOTE — Telephone Encounter (Signed)
[employer] ([place]) make you aware [facility] has discontinued medication due to not being able to contact pt.

## 2023-06-26 NOTE — Progress Notes (Unsigned)
PATIENT: Shannon Clay DOB: 09-Dec-1995  REASON FOR VISIT: follow up HISTORY FROM: patient PRIMARY NEUROLOGIST: Dr. Vickey Huger  No chief complaint on file.    HISTORY OF PRESENT ILLNESS: Today 06/26/23:  Shannon Clay is a 27 y.o. female with a history of narcolepsy. Returns today for follow-up.      11/19/22: Shannon Clay is a 27 y.o. female with a history of narcolepsy. Returns today for follow-up.  Remains on Sunosi 150 mg daily. at the last visit we added on Provigil.  Reports that the addition has not made much of a difference. Still feeling sleepy throughout the day. Able to work but reports that he thinks about going to bed most of the day.  He states that he has had some cataplectic events in which his head will nod but this is rare.   04/23/22:Shannon Clay is a 27 year old. At the last visit sunosi increased. Sunosi has been beneficial but getting tired midday. May get burst of energy around 3-4 PM. Tried against once home from work. Could just go to sleep at that time. Has been on provigil in the past and it was helpful but didn't last all day.  HISTORY Shannon Clay is a 27 y.o. adult who has been followed in this office for narcolepsy.  He reports that he does not feel the medicine is working as well.  He is having a hard time staying awake during the day.  Feels like his head is heavy.  He has tried Provigil and Nuvigil in the past.  Currently on Sunosi 75 mg daily.  Currently being treated for depression.    REVIEW OF SYSTEMS: Out of a complete 14 system review of symptoms, the patient complains only of the following symptoms, and all other reviewed systems are negative.   ALLERGIES: Allergies  Allergen Reactions   Latex Swelling    Swelling    Sulfa Antibiotics Rash    HOME MEDICATIONS: Outpatient Medications Prior to Visit  Medication Sig Dispense Refill   acetaminophen (TYLENOL) 500 MG tablet Take 500 mg by mouth every 6 (six) hours as needed for mild pain.      albuterol (PROAIR HFA) 108 (90 Base) MCG/ACT inhaler Inhale 2 puffs into the lungs every 4 (four) hours as needed for wheezing or shortness of breath. 18 g 6   ibuprofen (ADVIL) 600 MG tablet Take 1 tablet (600 mg total) by mouth every 8 (eight) hours as needed. (Patient taking differently: Take 600 mg by mouth as needed.) 30 tablet 0   modafinil (PROVIGIL) 100 MG tablet Take 1 tablet (100 mg total) by mouth daily. 30 tablet 5   NEEDLE, DISP, 16 G (BD HYPODERMIC NEEDLE) 16G X 1" MISC Use with testosterone every 2 weeks 50 each 0   Solriamfetol HCl (SUNOSI) 150 MG TABS Take 1 tablet (150 mg total) by mouth daily. 30 tablet 2   testosterone cypionate (DEPO-TESTOSTERONE) 200 MG/ML injection Inject 0.5 mLs (100 mg total) into the muscle every 14 (fourteen) days. 3 mL 1   No facility-administered medications prior to visit.    PAST MEDICAL HISTORY: Past Medical History:  Diagnosis Date   ADHD (attention deficit hyperactivity disorder)    Asthma    Depression    Frequent headaches    Migraine     PAST SURGICAL HISTORY: No past surgical history on file.  FAMILY HISTORY: Family History  Problem Relation Age of Onset   ADD / ADHD Mother    Anxiety disorder  Mother    Mental illness Mother    OCD Father    Anxiety disorder Sister    Depression Sister    Narcolepsy Maternal Aunt    Depression Paternal Aunt    Arthritis Maternal Grandmother     SOCIAL HISTORY: Social History   Socioeconomic History   Marital status: Single    Spouse name: Not on file   Number of children: Not on file   Years of education: Not on file   Highest education level: Not on file  Occupational History   Not on file  Tobacco Use   Smoking status: Never   Smokeless tobacco: Never  Vaping Use   Vaping status: Former  Substance and Sexual Activity   Alcohol use: Not Currently    Comment: social   Drug use: No   Sexual activity: Never  Other Topics Concern   Not on file  Social History Narrative    Lives with parents   Right handed   Caffeine: 1 energy drink per day   Social Determinants of Health   Financial Resource Strain: Not on file  Food Insecurity: Not on file  Transportation Needs: Not on file  Physical Activity: Not on file  Stress: Not on file  Social Connections: Not on file  Intimate Partner Violence: Not on file      PHYSICAL EXAM  There were no vitals filed for this visit.   There is no height or weight on file to calculate BMI.  Generalized: Well developed, in no acute distress   Neurological examination  Mentation: Alert oriented to time, place, history taking. Follows all commands speech and language fluent Cranial nerve II-XII: Pupils were equal round reactive to light. Extraocular movements were full, visual field were full on confrontational test. Facial sensation and strength were normal.  Head turning and shoulder shrug  were normal and symmetric. Motor: The motor testing reveals 5 over 5 strength of all 4 extremities. Good symmetric motor tone is noted throughout.  Sensory: Sensory testing is intact to soft touch on all 4 extremities. No evidence of extinction is noted.  Coordination: Cerebellar testing reveals good finger-nose-finger and heel-to-shin bilaterally.  Gait and station: Gait is normal.    DIAGNOSTIC DATA (LABS, IMAGING, TESTING) - I reviewed patient records, labs, notes, testing and imaging myself where available.  Lab Results  Component Value Date   WBC 5.9 12/03/2021   HGB 15.6 (H) 12/03/2021   HCT 46.1 (H) 12/03/2021   MCV 86.8 12/03/2021   PLT 212 12/03/2021      Component Value Date/Time   NA 133 (L) 12/03/2021 0813   NA 143 11/25/2017 0921   K 3.6 12/03/2021 0813   CL 102 12/03/2021 0813   CO2 24 12/03/2021 0813   GLUCOSE 105 (H) 12/03/2021 0813   BUN 9 12/03/2021 0813   BUN 13 11/25/2017 0921   CREATININE 0.87 12/03/2021 0813   CALCIUM 8.6 (L) 12/03/2021 0813   PROT 7.1 12/03/2021 0813   PROT 6.3 11/25/2017  0921   ALBUMIN 4.3 12/03/2021 0813   ALBUMIN 4.5 11/25/2017 0921   AST 22 12/03/2021 0813   ALT 24 12/03/2021 0813   ALKPHOS 58 12/03/2021 0813   BILITOT 0.5 12/03/2021 0813   BILITOT 0.5 11/25/2017 0921   GFRNONAA >60 12/03/2021 0813   GFRAA 125 11/25/2017 0921   Lab Results  Component Value Date   CHOL 223 (H) 09/23/2020   HDL 41.80 09/23/2020   LDLCALC 97 02/18/2017   LDLDIRECT 145.0 09/23/2020  TRIG 241.0 (H) 09/23/2020   CHOLHDL 5 09/23/2020    Lab Results  Component Value Date   TSH 2.21 02/18/2017      ASSESSMENT AND PLAN 27 y.o. year old adult  has a past medical history of ADHD (attention deficit hyperactivity disorder), Asthma, Depression, Frequent headaches, and Migraine. here with:   Narcolepsy  Continue Sunosi 150 mg at bedtime Increase Provigil to 100 mg to be taken around 11-12 PM. If the increase in Provigil is not helpful may consider switching over to [facility] Follow-up in 6 months or sooner if needed     Butch Penny, MSN, NP-C 06/26/2023, 12:21 PM [facility] [address] [place], [place] [address] [phone]

## 2023-06-28 ENCOUNTER — Ambulatory Visit (INDEPENDENT_AMBULATORY_CARE_PROVIDER_SITE_OTHER): Payer: BLUE CROSS/BLUE SHIELD | Admitting: Adult Health

## 2023-06-28 ENCOUNTER — Encounter: Payer: Self-pay | Admitting: Adult Health

## 2023-06-28 VITALS — BP 132/87 | HR 75 | Ht 64.0 in | Wt 205.8 lb

## 2023-06-28 DIAGNOSIS — G47411 Narcolepsy with cataplexy: Secondary | ICD-10-CM | POA: Diagnosis not present

## 2023-06-28 MED ORDER — MODAFINIL 100 MG PO TABS: 50.0000 mg | ORAL_TABLET | Freq: Every day | ORAL | 5 refills | Status: AC

## 2023-06-28 NOTE — Patient Instructions (Addendum)
Your Plan:  Continue Wakix Add on provigil 50-100 mg daily PRN If your symptoms worsen or you develop new symptoms please let us know.  Will consider Xyrem or Trudee Kuster we will schedule your next appointment with Dr. Vickey Huger to discuss   Thank you for coming to see [place] at [facility]. I hope we have been able to provide you high quality care today.  You may receive a patient satisfaction survey over the next few weeks. We would appreciate your feedback and comments so that we may continue to improve ourselves and the health of our patients.

## 2023-07-01 ENCOUNTER — Telehealth: Payer: Self-pay | Admitting: Adult Health

## 2023-07-01 NOTE — Telephone Encounter (Signed)
Olivia from [facility] called and LVM requesting a P.A. for the pt's Wakix.  Cover My Meds Key #[id number] Please fill out Request that has been faxed.

## 2023-07-02 ENCOUNTER — Telehealth: Payer: Self-pay

## 2023-07-02 ENCOUNTER — Other Ambulatory Visit (HOSPITAL_COMMUNITY): Payer: Self-pay

## 2023-07-02 NOTE — Telephone Encounter (Signed)
Pharmacy Patient Advocate Encounter   Received notification from Physician's Office that prior authorization for Wakix 17.8MG  tablets is required/requested.   Insurance verification completed.   The patient is insured through [facility] .   Per test claim: PA required; PA started via CoverMyMeds. KEY [id number] . Waiting for clinical questions to populate.

## 2023-07-02 NOTE — Telephone Encounter (Signed)
PA request has been Submitted. New Encounter created for follow up. For additional info see Pharmacy Prior Auth telephone encounter from 07/02/2023.

## 2023-07-02 NOTE — Telephone Encounter (Signed)
[facility] Zollie Scale) Requesting additional information that needed, for the plan for PA for [facility] Contact info:[phone] CoverMyMeds key: [id number]

## 2023-07-05 NOTE — Telephone Encounter (Signed)
PA has expired-renewed PA-awaiting clinical questions.  Key: Jenita Seashore

## 2023-07-05 NOTE — Telephone Encounter (Signed)
Clinical questions have been submitted-awaiting determination

## 2023-07-06 ENCOUNTER — Other Ambulatory Visit (HOSPITAL_COMMUNITY): Payer: Self-pay

## 2023-07-06 NOTE — Telephone Encounter (Signed)
Pharmacy Patient Advocate Encounter  Received notification from [facility] that Prior Authorization for Wakix 17.8MG  tablets has been APPROVED from 07/05/2023 to 07/03/2024.   PA #/Case ID/Reference #: PA Case ID #: [id number]

## 2023-07-16 ENCOUNTER — Telehealth: Payer: Self-pay

## 2023-07-16 NOTE — Telephone Encounter (Signed)
*  [facility]  Prior Authorization form/request asks a question that requires your assistance. Please see the question below and advise accordingly.    Is patient still taking Modafinil? Last notes show that patient stated it did not help improve symptoms.

## 2023-07-16 NOTE — Telephone Encounter (Signed)
CMM Key: [employer]

## 2023-07-26 ENCOUNTER — Other Ambulatory Visit (HOSPITAL_COMMUNITY): Payer: Self-pay

## 2023-07-26 NOTE — Telephone Encounter (Signed)
Pharmacy Patient Advocate Encounter  Received notification from [facility] that Prior Authorization for Modafinil 100MG  tablets  has been APPROVED from 07/26/2023 to 07/25/2024. Ran test claim, Copay is $4.00. This test claim was processed through [facility]- copay amounts may vary at other pharmacies due to pharmacy/plan contracts, or as the patient moves through the different stages of their insurance plan.

## 2023-07-30 ENCOUNTER — Encounter: Payer: Self-pay | Admitting: Internal Medicine

## 2023-07-30 ENCOUNTER — Other Ambulatory Visit: Payer: Self-pay | Admitting: Internal Medicine

## 2023-07-30 ENCOUNTER — Ambulatory Visit (INDEPENDENT_AMBULATORY_CARE_PROVIDER_SITE_OTHER): Payer: BLUE CROSS/BLUE SHIELD | Admitting: Internal Medicine

## 2023-07-30 VITALS — BP 112/80 | HR 71 | Temp 98.3°F | Ht 64.0 in | Wt 206.0 lb

## 2023-07-30 DIAGNOSIS — F331 Major depressive disorder, recurrent, moderate: Secondary | ICD-10-CM | POA: Diagnosis not present

## 2023-07-30 DIAGNOSIS — E349 Endocrine disorder, unspecified: Secondary | ICD-10-CM

## 2023-07-30 DIAGNOSIS — F329 Major depressive disorder, single episode, unspecified: Secondary | ICD-10-CM | POA: Insufficient documentation

## 2023-07-30 DIAGNOSIS — Z23 Encounter for immunization: Secondary | ICD-10-CM

## 2023-07-30 DIAGNOSIS — G4719 Other hypersomnia: Secondary | ICD-10-CM

## 2023-07-30 LAB — COMPREHENSIVE METABOLIC PANEL
ALT: 20 U/L (ref 0–35)
AST: 15 U/L (ref 0–37)
Albumin: 4.3 g/dL (ref 3.5–5.2)
Alkaline Phosphatase: 51 U/L (ref 39–117)
BUN: 11 mg/dL (ref 6–23)
CO2: 26 meq/L (ref 19–32)
Calcium: 9.2 mg/dL (ref 8.4–10.5)
Chloride: 105 meq/L (ref 96–112)
Creatinine, Ser: 0.84 mg/dL (ref 0.40–1.20)
GFR: 94.69 mL/min (ref >60.00)
Glucose, Bld: 99 mg/dL (ref 70–99)
Potassium: 4.3 meq/L (ref 3.5–5.1)
Sodium: 140 meq/L (ref 135–145)
Total Bilirubin: 0.5 mg/dL (ref 0.2–1.2)
Total Protein: 6.7 g/dL (ref 6.0–8.3)

## 2023-07-30 LAB — LIPID PANEL
Cholesterol: 206 mg/dL — ABNORMAL HIGH (ref 0–200)
HDL: 41 mg/dL (ref >39.00)
LDL Cholesterol: 144 mg/dL — ABNORMAL HIGH (ref 0–99)
NonHDL: 165.1
Total CHOL/HDL Ratio: 5
Triglycerides: 104 mg/dL (ref 0.0–149.0)
VLDL: 20.8 mg/dL (ref 0.0–40.0)

## 2023-07-30 LAB — CBC
HCT: 42.9 % (ref 36.0–46.0)
Hemoglobin: 14.1 g/dL (ref 12.0–15.0)
MCHC: 32.9 g/dL (ref 30.0–36.0)
MCV: 87.4 fL (ref 78.0–100.0)
Platelets: 328 10*3/uL (ref 150.0–400.0)
RBC: 4.91 Mil/uL (ref 3.87–5.11)
RDW: 13 % (ref 11.5–15.5)
WBC: 7.8 10*3/uL (ref 4.0–10.5)

## 2023-07-30 LAB — VITAMIN B12: Vitamin B-12: 389 pg/mL (ref 211–911)

## 2023-07-30 LAB — TSH: TSH: 2.23 u[IU]/mL (ref 0.35–5.50)

## 2023-07-30 LAB — HEMOGLOBIN A1C: Hgb A1c MFr Bld: 5.5 % (ref 4.6–6.5)

## 2023-07-30 LAB — VITAMIN D 25 HYDROXY (VIT D DEFICIENCY, FRACTURES): VITD: 13.27 ng/mL — ABNORMAL LOW (ref 30.00–100.00)

## 2023-07-30 MED ORDER — VITAMIN D (ERGOCALCIFEROL) 1.25 MG (50000 UNIT) PO CAPS: 50000.0000 [IU] | ORAL_CAPSULE | ORAL | 0 refills | Status: DC

## 2023-07-30 MED ORDER — CITALOPRAM HYDROBROMIDE 20 MG PO TABS: 20.0000 mg | ORAL_TABLET | Freq: Every day | ORAL | 3 refills | Status: DC

## 2023-07-30 NOTE — Patient Instructions (Addendum)
We have sent in the citalopram.   We will check the labs today.

## 2023-07-30 NOTE — Assessment & Plan Note (Signed)
Citalopram has worked best in past. Starting with 20 mg daily and can adjust after 4 weeks if dose change needed.

## 2023-07-30 NOTE — Addendum Note (Signed)
Addended by: Hillard Danker A on: 07/30/2023 11:07 AM   Modules accepted: Level of Service

## 2023-07-30 NOTE — Progress Notes (Signed)
   Subjective:   Patient ID: Shannon Clay, adult    DOB: December 17, 1995, 27 y.o.   MRN: [id number]  HPI The patient is a 27 YO coming in for some depression symptoms starting recently and struggling to lose weight.   Review of Systems  Constitutional:  Positive for fatigue and unexpected weight change.  HENT: Negative.    Eyes: Negative.   Respiratory:  Negative for cough, chest tightness and shortness of breath.   Cardiovascular:  Negative for chest pain, palpitations and leg swelling.  Gastrointestinal:  Negative for abdominal distention, abdominal pain, constipation, diarrhea, nausea and vomiting.  Musculoskeletal: Negative.   Skin: Negative.   Neurological: Negative.   Psychiatric/Behavioral:  Positive for dysphoric mood.     Objective:  Physical Exam Constitutional:      Appearance: He is well-developed.  HENT:     Head: Normocephalic and atraumatic.  Cardiovascular:     Rate and Rhythm: Normal rate and regular rhythm.  Pulmonary:     Effort: Pulmonary effort is normal. No respiratory distress.     Breath sounds: Normal breath sounds. No wheezing or rales.  Abdominal:     General: Bowel sounds are normal. There is no distension.     Palpations: Abdomen is soft.     Tenderness: There is no abdominal tenderness. There is no rebound.  Musculoskeletal:     Cervical back: Normal range of motion.  Skin:    General: Skin is warm and dry.  Neurological:     Mental Status: He is alert and oriented to person, place, and time.     Coordination: Coordination normal.     Vitals:   07/30/23 1033  BP: 112/80  Pulse: 71  Temp: 98.3 F (36.8 C)  TempSrc: Oral  SpO2: 99%  Weight: 206 lb (93.4 kg)  Height: 5\' 4"  (1.626 m)   Assessment & Plan:  Flu and prevnar 20 given at visit

## 2023-07-30 NOTE — Assessment & Plan Note (Signed)
Checking CMP and CBC and vitamin D and TSH for fatigue.

## 2023-07-30 NOTE — Assessment & Plan Note (Signed)
Checking testosterone levels and adjust as needed. Currently taking 100 mg q 14 days IM.

## 2023-08-02 ENCOUNTER — Encounter: Payer: Self-pay | Admitting: Internal Medicine

## 2023-08-03 ENCOUNTER — Other Ambulatory Visit: Payer: Self-pay

## 2023-08-03 LAB — CP TESTOSTERONE, BIO-FEMALE/CHILDREN
Albumin: 4.4 g/dL (ref 3.6–5.1)
Sex Hormone Binding: 29.7 nmol/L (ref 17–124)
TESTOSTERONE, BIOAVAILABLE: 7.4 ng/dL (ref 0.5–8.5)
Testosterone, Free: 3.7 pg/mL (ref 0.2–5.0)
Testosterone, Total, LC-MS-MS: 29 ng/dL (ref 2–45)

## 2023-08-03 MED ORDER — VITAMIN D (ERGOCALCIFEROL) 1.25 MG (50000 UNIT) PO CAPS: 50000.0000 [IU] | ORAL_CAPSULE | ORAL | 0 refills | Status: AC

## 2023-08-06 ENCOUNTER — Other Ambulatory Visit: Payer: Self-pay

## 2023-08-06 MED ORDER — TESTOSTERONE CYPIONATE 200 MG/ML IM SOLN: 100.0000 mg | INTRAMUSCULAR | 1 refills | Status: DC

## 2023-08-06 NOTE — Telephone Encounter (Signed)
Prescription has been sent in  

## 2023-08-18 ENCOUNTER — Telehealth: Payer: Self-pay | Admitting: Neurology

## 2023-08-18 ENCOUNTER — Encounter: Payer: Self-pay | Admitting: Neurology

## 2023-08-18 ENCOUNTER — Ambulatory Visit (INDEPENDENT_AMBULATORY_CARE_PROVIDER_SITE_OTHER): Payer: BLUE CROSS/BLUE SHIELD | Admitting: Neurology

## 2023-08-18 VITALS — BP 118/76 | HR 72 | Ht 64.0 in | Wt 203.0 lb

## 2023-08-18 DIAGNOSIS — G4719 Other hypersomnia: Secondary | ICD-10-CM

## 2023-08-18 DIAGNOSIS — G47411 Narcolepsy with cataplexy: Secondary | ICD-10-CM

## 2023-08-18 DIAGNOSIS — G4759 Other parasomnia: Secondary | ICD-10-CM

## 2023-08-18 DIAGNOSIS — Z789 Other specified health status: Secondary | ICD-10-CM | POA: Diagnosis not present

## 2023-08-18 MED ORDER — SODIUM OXYBATE 500 MG/ML PO SOLN: ORAL | 1 refills | Status: DC

## 2023-08-18 NOTE — Telephone Encounter (Signed)
Xyrem rems enrollment was completed and signed for the patient. Will send to the REMS program to get the patient enrolled.

## 2023-08-18 NOTE — Telephone Encounter (Signed)
Pharmacy called in asking if Xyrem could be substituted for generic. I spoke with Baird Lyons, RN and confirmed that generic was ok. I relayed this info to pharmacy tech and she states she will need a nurse or CMA to speak with a pharmacist to confirm that information. Their call back # is [phone] option 3 and then 4.

## 2023-08-18 NOTE — Patient Instructions (Signed)
As discussed, Xyrem has to be taken with very mindful caution: Taking Xyrem correctly is key. This means, take it only when you are fully ready to fall asleep, while in bed and refrain from doing any other activities, even brushing  your teeth after taking your first dose. The second dose will be about 2-1/2-4 hours after his first dose. You can go to the bathroom before your 2nd dose. Take your first dose, when actually IN BED, ready to sleep. No sitting up in bed, NO reading, NO using the cell phone or computer, NO getting up to use the bathroom. Take care of everything BEFORE sleep time. Try NOT to skip the second dose as the Xyrem is not going to stay in your system long enough with only one dose. Do not drink alcohol with Xyrem. If you do drink Alcohol, you cannot take your Xyrem doses that night.    Sodium Oxybate Solution What is this medication? SODIUM OXYBATE (SOE dee um OX i bate) treats narcolepsy. It works by decreasing daytime sleepiness, which can help you sleep better at night. It also reduces the number of episodes of sudden muscle weakness (cataplexy). This medicine may be used for other purposes; ask your health care provider or pharmacist if you have questions. COMMON BRAND NAME(S): Xyrem What should I tell my care team before I take this medication? They need to know if you have any of these conditions: Depression Diet low in salt Frequently drink alcohol Heart disease High blood pressure History of substance use disorder Kidney disease Liver disease Lung or breathing disease, such as asthma or COPD Mental health conditions Sleep apnea Succinic semialdehyde dehydrogenase deficiency Suicidal thoughts, plans, or attempt An unusual or allergic reaction to sodium oxybate, other medications, foods, dyes, or preservatives Pregnant or trying to get pregnant Breastfeeding How should I use this medication? Take this medication by mouth. This medication is taken at night as 2  separate doses. Take the first dose at bedtime, while you are in bed. Lie down right away after taking the dose and remain in bed. Take the second dose 2.5 to 4 hours after the first dose. You may need to set an alarm clock to wake up. Mix each dose with one-fourth cup of water in the provided containers. Take it on an empty stomach, at least 2 hours after food. Keep taking it unless your care team tells you to stop. A special MedGuide will be given to you by the pharmacist with each prescription and refill. Be sure to read this information carefully each time. This medication comes with INSTRUCTIONS FOR USE. Ask your pharmacist for directions on how to use this medication. Read the information carefully. Talk to your pharmacist or care team if you have questions. Talk to your care team about the use of this medication in children. While this medication may be prescribed for children as young as 7 years for selected conditions, precautions do apply. Overdosage: If you think you have taken too much of this medicine contact a poison control center or emergency room at once. NOTE: This medicine is only for you. Do not share this medicine with others. What if I miss a dose? If you miss a dose, skip it. Take your next dose at the normal time. Do not take extra or 2 doses at the same time to make up for the missed dose. What may interact with this medication? Do not take this medication with any of the following: Alcohol Medications that help you  fall asleep This medication may also interact with the following: Benzodiazepines, such as alprazolam, diazepam, or lorazepam Certain antihistamines Certain medications for depression, such as amitriptyline, fluoxetine, sertraline, or trazodone Certain medications for seizures such as divalproex sodium, phenobarbital, primidone, valproic acid Medications that cause drowsiness before a procedure, such as propofol Medications that relax muscles Opioids for pain  or cough Phenothiazines, such as chlorpromazine, prochlorperazine, thioridazine This list may not describe all possible interactions. Give your health care provider a list of all the medicines, herbs, non-prescription drugs, or dietary supplements you use. Also tell them if you smoke, drink alcohol, or use illegal drugs. Some items may interact with your medicine. What should I watch for while using this medication? Visit your care team for regular checks on your progress. Tell your care team if your symptoms do not start to get better or if they get worse. This medication has a risk of abuse and dependence. Your care team will check you for this while you take this medication. This medication may affect your coordination, reaction time, or judgment. Do not engage in activities that require mental alertness, such as driving or operating machinery, for at least 6 hours after taking this medication. You may fall asleep quickly after taking this medication. Remain in bed and do not attempt to sit or stand up. Drinking alcohol with this medication can increase the risk of these side effects. You may do unusual sleep behaviors or activities you do not remember the day after taking this medication. Activities include driving, making or eating food, talking on the phone, sexual activity, or sleep walking. Stop taking this medication and call your care team right away if you find out you have done activities like this. This medication may cause thoughts of suicide or depression. This includes sudden changes in mood, behaviors, or thoughts. These changes can happen at any time but are more common in the beginning of treatment or after a change in dose. Call your care team right away if you experience these thoughts or worsening depression. What side effects may I notice from receiving this medication? Side effects that you should report to your care team as soon as possible: Allergic reactions--skin rash, itching,  hives, swelling of the face, lips, tongue, or throat CNS depression--slow or shallow breathing, shortness of breath, feeling faint, dizziness, confusion, trouble staying awake Mood and behavior changes--anxiety, nervousness, confusion, hallucinations, irritability, hostility, thoughts of suicide or self-harm, worsening mood, feelings of depression Sleep apnea--loud snoring, gasping or choking during sleep, daytime sleepiness Sleepwalking Side effects that usually do not require medical attention (report to your care team if they continue or are bothersome): Bedwetting Dizziness Headache Loss of appetite Nausea Vomiting This list may not describe all possible side effects. Call your doctor for medical advice about side effects. You may report side effects to FDA at 1-800-FDA-1088. Where should I keep my medication? Keep out of the reach of children and pets. This medication can be abused. Keep it in a safe place to protect it from theft. Do not share it with anyone. It is only for you. Selling or giving away this medication is dangerous and against the law. Store at room temperature between 20 and 25 degrees C (68 and 77 degrees F). Keep this medication in the original container. Get rid of any unused medication after the expiration date. After mixing a dose of this medication with water, the medication should be taken within 24 hours. Get rid of any unused and diluted medication. This  medication may cause harm and death if it is taken by other adults, children, or pets. It is important to get rid of the medication as soon as you no longer need it or it is expired. You can do this in two ways: Take the medication to a medication take-back program. Check with your pharmacy or law enforcement to find a location. If you cannot return the medication, flush it down the toilet. NOTE: This sheet is a summary. It may not cover all possible information. If you have questions about this medicine, talk to  your doctor, pharmacist, or health care provider.  2024 Elsevier/Gold Standard (2022-08-06 00:00:00)

## 2023-08-18 NOTE — Addendum Note (Signed)
Addended by: Judi Cong on: 08/18/2023 02:08 PM   Modules accepted: Orders

## 2023-08-18 NOTE — Progress Notes (Signed)
Provider:  Melvyn Novas, MD  Primary Care Physician:  Myrlene Broker, MD [address] [place] [address]     Referring Provider: Myrlene Broker, Md [address] [place],  [place] [address]          Chief Complaint according to patient   Patient presents with:     N fight  the desire to go to sleep and my nights are short and broken "  Fatigue, sleepiness , snoring, fragmented sleep, Insomnia-  energetic at night, fatigued in daytime. Power naps.  Vivid dreams.            HISTORY OF PRESENT ILLNESS:  Shannon Clay is a 27 y.o. adult patient who is here for revisit 08/18/2023 for.  Chief concern according to patient : [name] reports that the whole life now is dictated by the urge to sleep and sleepiness.  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   Collapse he started Wakix 17.8 mg daily restarted Provigil as needed in addition and we have set that he could consider Xyrem or Xywav if these medications do not help.  Will not sufficiently controlled fatigue and sleepiness.  Narcoleptics may be fatigued but usually are excessively daytime sleepy.    The fatigue itself can also be dependent on chronic inflammatory diseases /autoimmune diseases /cancer /treatments medication side effects /depression and anxiety.  Shannon Clay endorsed the Epworth Sleepiness Scale at 22 out of 24 points, fatigue severity scale at 58 out of 63 points.  There has been a history of clinical depression and of ADHD.  There has also been a history of frequent migraine headaches and the possibility of another type of headaches as well as asthma.  So my discussion today will be about a more effective treatment that is meant to treat truly narcolepsy but would also treat idiopathic hypersomnia.  And this would be the sodium oxybate category Xyrem, Xywav or Luminexx of  the current product names available in that category.  Shannon Clay lives with his parents and feels save to start these medications.  He would titrate preferable on Xyrem and likes the idea of once a night dosing.  I will prescribe a titration dose and like to start on twice-nightly 2.5 grams, the following week to 3.5 grams and the third week to 4.5 grams, if the patient has trouble  with not being morning arousable /and or is missing the second dose, will use Xywav for once a night dosing.   He reports sleeping only 5 hours at night, waking up paralyzed and with very real dreams intruding( before medications were started) .   [name] has transitioned since last seen by me in 2019- and now gained weight on testosterone and grew a beard. 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 No longer night shifts. 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  Not known to snore.          Procedure Orders:  1. Nocturnal polysomnography with REM behavior disorder [[id number]] ordered by Melvyn Novas, MD at 05/17/17 [phone]   Pre-procedure Diagnoses  1. 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 Abnormal dreams [F51.8]  4. Sleep-related hallucinations [R44.1]  5. Excessive daytime sleepiness [G47.19]  6. 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 The patient endorsed the Epworth Sleepiness Scale at 17/24 points and the FSS at 48.   The patient's weight 142 pounds with a height of 65 (inches), resulting in a BMI of 23.5 kg/m2. The  patient's neck circumference measured 13.8 inches.   CURRENT MEDICATIONS: Celexa.   PROCEDURE:  This is a multichannel digital polysomnogram utilizing the Somnostar 11.2 system.  Electrodes and sensors were applied and monitored per AASM Specifications.   EEG, EOG, Chin and Limb EMG, were sampled at 200 Hz.  ECG, Snore and Nasal Pressure, Thermal Airflow, Respiratory Effort, CPAP Flow and Pressure, Oximetry was sampled at 50 Hz. Digital video and audio were recorded.       BASELINE STUDY Lights Out was at 20:47 and Lights On at 05:31.  Total recording time (TRT) was 524.5 minutes, with a total sleep time (TST) of 490.5 minutes.   The patient's sleep latency was 7.5 minutes.  REM latency was 45 minutes.  The sleep efficiency was 93.5 %.                     SLEEP ARCHITECTURE: WASO (Wake after sleep onset) was 26 minutes.  There were 11 minutes in Stage N1, 175 minutes Stage N2, 153 minutes Stage N3 and 151.5 minutes in Stage REM.  The percentage of Stage N1 was 2.2%, Stage N2 was 35.7%, Stage N3 was 31.2% and Stage R (REM sleep) was 30.9%.  RESPIRATORY ANALYSIS:  There were a total of 8 respiratory events:  1 obstructive apnea, 2 central apneas and 0 mixed apneas with 5 hypopneas. The patient also had 0 respiratory event related arousals (RERAs).    The total APNEA/HYPOPNEA INDEX (AHI) was 1.0/hour and the total RESPIRATORY DISTURBANCE INDEX was 1.0 /hour.  5 events occurred in REM sleep and 6 events in NREM. The REM AHI was 2.0 /hour, versus a non-REM AHI of 0.5. The patient spent 490.5 minutes of total sleep time in the supine position and 0 minutes in non-supine. The supine AHI was 1.0 versus a non-supine AHI of 0.0.     Valid study for MSLT to follow.        Review of Systems: Out of a complete 14 system review, the patient complains of only the following symptoms, and all other reviewed systems are negative.:  " all day I fight  the desire to go to sleep and my nights are short and broken "   Fatigue, sleepiness , snoring,  fragmented sleep, Insomnia-  energetic at night, fatigued in daytime. Power naps.     How likely are you to doze in the following situations: 0 = not likely, 1 = slight chance, 2 = moderate chance, 3 = high chance   Sitting and Reading? Watching Television? Sitting inactive in a public place (theater or meeting)? As a passenger in a car for an hour without a break? Lying down in the afternoon when circumstances permit? Sitting and talking to someone? Sitting quietly after lunch without alcohol? In a car, while stopped for a few minutes in traffic?   Shannon Clay endorsed the Epworth Sleepiness Scale at 22 out of 24 points, fatigue severity scale at 58 out of 63 points.  There has been a history of clinical depression and of ADHD.   There has also been a history of frequent migraine headaches and the possibility of another type of headaches as well as asthma.   No longer overusing caffeine.   Social History   Socioeconomic History   Marital status: Single    Spouse name: Not on file   Number of children: Not on file   Years of education: Not on file   Highest education level: Not on file  Occupational History   Not on file  Tobacco Use   Smoking status: Never   Smokeless tobacco: Never  Vaping Use   Vaping status: Former  Substance and Sexual Activity   Alcohol use: Not Currently    Comment: social   Drug use: No   Sexual activity: Never  Other Topics Concern   Not on file  Social History Narrative   Lives with parents   Right handed   Caffeine: 1 energy drink per day   Social Determinants of Health   Financial Resource Strain: Not on file  Food Insecurity: Not on file  Transportation Needs: Not on file  Physical Activity: Not on file  Stress: Not on file  Social Connections: Not on file    Family History  Problem Relation Age of Onset   ADD / ADHD Mother    Anxiety disorder Mother    Mental illness Mother    OCD Father    Anxiety  disorder Sister    Depression Sister    Narcolepsy Maternal Aunt    Depression Paternal Aunt    Arthritis Maternal Grandmother     Past Medical History:  Diagnosis Date   ADHD (attention deficit hyperactivity disorder)    Asthma    Depression    Frequent headaches    Migraine     No past surgical history on file.   Current Outpatient Medications on File Prior to Visit  Medication Sig Dispense Refill   citalopram (CELEXA) 20 MG tablet Take 1 tablet (20 mg total) by mouth daily. 30 tablet 3   modafinil (PROVIGIL) 100 MG tablet Take 1 tablet (100 mg total) by mouth daily. 30 tablet 5   modafinil (PROVIGIL) 100 MG tablet Take 0.5-1 tablets (50-100 mg total) by mouth daily. 30 tablet 5   NEEDLE, DISP, 16 G (BD HYPODERMIC NEEDLE) 16G X 1" MISC Use with testosterone every 2 weeks 50 each 0   Solriamfetol HCl (SUNOSI) 150 MG TABS Take 1 tablet (150 mg total) by mouth daily. 30 tablet 2   testosterone cypionate (DEPO-TESTOSTERONE) 200 MG/ML injection Inject 0.5 mLs (100 mg total) into the muscle every 14 (fourteen) days. 3 mL 1   Vitamin D, Ergocalciferol, (DRISDOL) 1.25 MG (50000 UNIT) CAPS capsule Take 1 capsule (50,000  Units total) by mouth every 7 (seven) days. 12 capsule 0   WAKIX 17.8 MG TABS Take 1 tablet by mouth daily.     acetaminophen (TYLENOL) 500 MG tablet Take 500 mg by mouth every 6 (six) hours as needed for mild pain (pain score 1-3). (Patient not taking: Reported on 08/18/2023)     albuterol (PROAIR HFA) 108 (90 Base) MCG/ACT inhaler Inhale 2 puffs into the lungs every 4 (four) hours as needed for wheezing or shortness of breath. 18 g 6   ibuprofen (ADVIL) 600 MG tablet Take 1 tablet (600 mg total) by mouth every 8 (eight) hours as needed. (Patient not taking: Reported on 08/18/2023) 30 tablet 0   No current facility-administered medications on file prior to visit.    Allergies  Allergen Reactions   Latex Swelling    Swelling    Sulfa Antibiotics Rash     DIAGNOSTIC  DATA (LABS, IMAGING, TESTING) - I reviewed patient records, labs, notes, testing and imaging myself where available.  Lab Results  Component Value Date   WBC 7.8 07/30/2023   HGB 14.1 07/30/2023   HCT 42.9 07/30/2023   MCV 87.4 07/30/2023   PLT 328.0 07/30/2023      Component Value Date/Time   NA 140 07/30/2023 1103   NA 143 11/25/2017 0921   K 4.3 07/30/2023 1103   CL 105 07/30/2023 1103   CO2 26 07/30/2023 1103   GLUCOSE 99 07/30/2023 1103   BUN 11 07/30/2023 1103   BUN 13 11/25/2017 0921   CREATININE 0.84 07/30/2023 1103   CALCIUM 9.2 07/30/2023 1103   PROT 6.7 07/30/2023 1103   PROT 6.3 11/25/2017 0921   ALBUMIN 4.3 07/30/2023 1103   ALBUMIN 4.5 11/25/2017 0921   AST 15 07/30/2023 1103   ALT 20 07/30/2023 1103   ALKPHOS 51 07/30/2023 1103   BILITOT 0.5 07/30/2023 1103   BILITOT 0.5 11/25/2017 0921   GFRNONAA >60 12/03/2021 0813   GFRAA 125 11/25/2017 0921   Lab Results  Component Value Date   CHOL 206 (H) 07/30/2023   HDL 41.00 07/30/2023   LDLCALC 144 (H) 07/30/2023   LDLDIRECT 145.0 09/23/2020   TRIG 104.0 07/30/2023   CHOLHDL 5 07/30/2023   Lab Results  Component Value Date   HGBA1C 5.5 07/30/2023   Lab Results  Component Value Date   VITAMINB12 389 07/30/2023   Lab Results  Component Value Date   TSH 2.23 07/30/2023    PHYSICAL EXAM:  Today's Vitals   08/18/23 1254  BP: 118/76  Pulse: 72  Weight: 203 lb (92.1 kg)  Height: 5\' 4"  (1.626 m)   Body mass index is 34.84 kg/m.   Wt Readings from Last 3 Encounters:  08/18/23 203 lb (92.1 kg)  07/30/23 206 lb (93.4 kg)  06/28/23 205 lb 12.8 oz (93.4 kg)     Ht Readings from Last 3 Encounters:  08/18/23 5\' 4"  (1.626 m)  07/30/23 5\' 4"  (1.626 m)  06/28/23 5\' 4"  (1.626 m)      General:  The patient is awake, alert and appears not in acute distress. The patient is well groomed. Head: Normocephalic, atraumatic. Neck is supple. Mallampati 2,  neck circumference:13.75. Nasal airflow patent ,  Retrognathia is seen. Bruxism marks.  Cardiovascular:  Regular rate and rhythm , without  murmurs or carotid bruit, and without distended neck veins. Respiratory: Lungs are clear to auscultation. Skin: acne -  no evidence of edema, or rash Trunk: BMI is 24. The patient's posture is erect.  Neurologic exam : No quite alert.  Attention span & concentration ability appears normal.  Speech is fluent, without dysarthria, dysphonia or aphasia.  Mood and affect are appropriate.   Cranial nerves: Pupils are equal and briskly reactive to light.  Extraocular movements  without nystagmus. Visual fields by finger perimetry are intact. Hearing to finger rub intact.  Facial sensation intact to fine touch. Facial motor strength is symmetric and tongue and uvula move midline. Shoulder shrug was symmetrical. Motor exam:  Normal tone, muscle bulk and symmetric strength in all extremities. Sensory:  Fine touch, pinprick and vibration were tested and normal. Coordination:  Finger-to-nose maneuver  normal without evidence of ataxia, dysmetria or tremor.   Gait and station: Patient walks without assistive device and is able unassisted to climb up to the exam table. Strength within normal limits. Stance is stable and normal.   Deep tendon reflexes: in the  upper and lower extremities are symmetric and intact. Babinski maneuver response is downgoing.  ASSESSMENT AND PLAN 27 y.o. year old adult  here with:    1) Excessive daytime sleepiness.  High degree of fatigue , persistent on wakix and modafinil. 2018 PSG was negative for OSA. Patient was a shift worker at th time.    Convert to titration on XYREM or XyWAV-  once starting on it, will d/c wakix and the patient can use modafinil still prn.   Follow up always with CMET and with depression score , first time in 3 months with me or NP and afterwards q 6 months ( REMS )     I plan to follow up through our NP within 3 months.   I would like to thank  Myrlene Broker, Md [address] [place],  [place] [address] for allowing me to meet with and to take care of this pleasant patient.    After spending a total time of  40  minutes face to face and additional time for physical and neurologic examination, review of laboratory studies,  personal review of imaging studies, reports and results of other testing and review of referral information / records as far as provided in visit,   We discussed depression, anxiety, idiopathic hypersomnia, and excessive daytime sleepiness narcolepsy and cataplexy.   Electronically signed by: Melvyn Novas, MD 08/18/2023 1:08 PM  [facility] Board certified by The [employer] of Sleep Medicine and Diplomate of the [employer] of Sleep Medicine. Board certified In Neurology through the ABPN, Fellow of the [employer] of Neu[employer].

## 2023-08-18 NOTE — Telephone Encounter (Signed)
I have had Dr Vickey Huger re sign another form completed on the substitution permitted spot and will fax that over to them

## 2023-08-19 ENCOUNTER — Telehealth: Payer: Self-pay | Admitting: Neurology

## 2023-08-19 NOTE — Telephone Encounter (Signed)
Received call from [facility] at [facility] stated Xyrem is not coming up as a specialty drug and this will need to be sent to the commercial department fax # [phone]

## 2023-08-19 NOTE — Telephone Encounter (Signed)
PA completed for generic Xyrem through [facility] [id number] Will await questions

## 2023-08-19 NOTE — Telephone Encounter (Signed)
This medication is a specialty drug and I completed the form that has the medication clearly listed to be checked. I have faxed everything to the number provided but I did note that it is a specialty drug and the need to make sure its in the right hands.  Received confirmation that it went through.

## 2023-08-19 NOTE — Telephone Encounter (Signed)
The questions were not populating on CMM. I was able to have a form completed and have faxed to [facility].

## 2023-08-24 ENCOUNTER — Other Ambulatory Visit: Payer: Self-pay | Admitting: Internal Medicine

## 2023-08-25 MED ORDER — XYWAV 500 MG/ML PO SOLN: ORAL | 1 refills | Status: DC

## 2023-08-25 NOTE — Telephone Encounter (Signed)
PA denied for Sodium oxybate as insurance prefers [employer]. Will send orders in for [facility]. Pt is aware and is ok with the switch.

## 2023-08-25 NOTE — Addendum Note (Signed)
Addended by: Judi Cong on: 08/25/2023 08:24 AM   Modules accepted: Orders

## 2023-08-27 LAB — NARCOLEPSY EVALUATION
DQA1*01:02: NEGATIVE
DQB1*06:02: NEGATIVE

## 2023-08-31 ENCOUNTER — Other Ambulatory Visit (HOSPITAL_COMMUNITY): Payer: Self-pay

## 2023-08-31 ENCOUNTER — Telehealth: Payer: Self-pay | Admitting: Pharmacy Technician

## 2023-08-31 NOTE — Telephone Encounter (Signed)
Pharmacy Patient Advocate Encounter  Received notification from [facility] that Prior Authorization for Testosterone Cypionate 200MG /ML intramuscular solution has been APPROVED from 08/31/2023 to 08/30/2026. Ran test claim, Copay is $4.00. This test claim was processed through [facility]- copay amounts may vary at other pharmacies due to pharmacy/plan contracts, or as the patient moves through the different stages of their insurance plan.   PA #/Case ID/Reference #: [id number] Key: [id number]

## 2023-09-02 ENCOUNTER — Telehealth: Payer: Self-pay | Admitting: Neurology

## 2023-09-02 NOTE — Telephone Encounter (Signed)
PA submitted for the patient on [facility] [id number] Will await determination

## 2023-09-02 NOTE — Telephone Encounter (Signed)
PA approved for the Xywav through [facility] 09/02/2023-09/01/2024

## 2023-09-23 ENCOUNTER — Encounter: Payer: Self-pay | Admitting: Adult Health

## 2023-09-27 ENCOUNTER — Encounter: Payer: Self-pay | Admitting: Internal Medicine

## 2023-09-28 NOTE — Telephone Encounter (Signed)
To whom it may concern,  My patient, Shannon Clay, born 1996/04/25, suffers from a neurologic illness that impairs her alertness. To prevent side effects and accidents it would be advisable to have the patient work no longer than 10 hours a day, and not into the nighttime hours.

## 2023-10-01 ENCOUNTER — Telehealth: Payer: Self-pay | Admitting: Neurology

## 2023-10-01 NOTE — Telephone Encounter (Signed)
Please copy and paste to letterhead. CD

## 2023-10-07 ENCOUNTER — Encounter: Payer: Self-pay | Admitting: Neurology

## 2023-10-29 ENCOUNTER — Telehealth: Payer: Self-pay | Admitting: Neurology

## 2023-10-29 NOTE — Telephone Encounter (Signed)
Called the pharmacy back. Was able to give the permission to move forward with getting her started

## 2023-10-29 NOTE — Telephone Encounter (Signed)
pharmacist Shannon Clay w/ [facility]  has called for approved from Dr to ship the  Ca, Mg, K, and Na Oxybates (XYWAV) 500 MG/ML SOLN with pt having asthma, suicidal thoughts and worsening depression. Call back # to discuss is [phone] option 3 then option 4

## 2023-11-03 DIAGNOSIS — Z0289 Encounter for other administrative examinations: Secondary | ICD-10-CM

## 2023-11-03 NOTE — Telephone Encounter (Signed)
Pt returned phone call. Transferring patient to Billing.

## 2023-11-04 ENCOUNTER — Telehealth: Payer: Self-pay | Admitting: *Deleted

## 2023-11-04 NOTE — Telephone Encounter (Signed)
Pt accommodation form faxed to [employer] [phone]

## 2023-11-24 ENCOUNTER — Ambulatory Visit: Payer: BLUE CROSS/BLUE SHIELD | Admitting: Adult Health

## 2023-12-01 ENCOUNTER — Telehealth: Payer: Self-pay | Admitting: Pharmacist

## 2023-12-01 ENCOUNTER — Other Ambulatory Visit (HOSPITAL_COMMUNITY): Payer: Self-pay

## 2023-12-01 NOTE — Telephone Encounter (Signed)
 Pharmacy Patient Advocate Encounter  Insurance verification completed.   The patient is insured through [facility]   Ran test claim for Sunosi. Currently a quantity of 30 is a 30 day supply and the co-pay is 9.00 . The current 30 day co-pay is, $9.00.  No PA needed at this time.  This test claim was processed through [facility]- copay amounts may vary at other pharmacies due to pharmacy/plan contracts, or as the patient moves through the different stages of their insurance plan.

## 2023-12-16 ENCOUNTER — Ambulatory Visit: Payer: BLUE CROSS/BLUE SHIELD | Admitting: Adult Health

## 2023-12-18 ENCOUNTER — Other Ambulatory Visit: Payer: Self-pay | Admitting: Internal Medicine

## 2023-12-28 ENCOUNTER — Ambulatory Visit: Admitting: Internal Medicine

## 2024-01-15 ENCOUNTER — Other Ambulatory Visit: Payer: Self-pay | Admitting: Internal Medicine

## 2024-01-18 ENCOUNTER — Telehealth: Admitting: Adult Health

## 2024-01-20 ENCOUNTER — Other Ambulatory Visit: Payer: Self-pay | Admitting: Internal Medicine

## 2024-01-26 ENCOUNTER — Encounter: Payer: Self-pay | Admitting: Neurology

## 2024-01-31 MED ORDER — XYWAV 500 MG/ML PO SOLN: ORAL | 0 refills | Status: DC

## 2024-01-31 NOTE — Telephone Encounter (Signed)
 Called pt and confirmed he is taking 4.5 g twice nightly. Will send refill in for that. Pt will need an follow up appt. If stable can be a VV

## 2024-01-31 NOTE — Telephone Encounter (Signed)
 Called patient and scheduled appointment with Dr. Albertina Hugger for 02/15/24 at 11am

## 2024-02-01 ENCOUNTER — Telehealth: Payer: Self-pay | Admitting: Neurology

## 2024-02-01 NOTE — Telephone Encounter (Signed)
 Marlena @ [facility] pharmacy [phone] option 3 then option 4 is asking for a call back with the date the start form was completed for the [facility].

## 2024-02-01 NOTE — Telephone Encounter (Signed)
 Called the pharmacy and was able to confirm the date on the recent script was 01/31/2024. Spoke with Shannon Clay to provide that information and nothing else was needed.

## 2024-02-15 ENCOUNTER — Ambulatory Visit: Admitting: Neurology

## 2024-02-23 ENCOUNTER — Telehealth: Payer: Self-pay | Admitting: Neurology

## 2024-02-23 NOTE — Telephone Encounter (Signed)
 VM box full, sent mychart msg informing pt of r/s needed for 6/30 appt- MD out.

## 2024-03-01 ENCOUNTER — Other Ambulatory Visit: Payer: Self-pay | Admitting: Neurology

## 2024-03-01 MED ORDER — XYWAV 500 MG/ML PO SOLN: ORAL | 0 refills | Status: AC

## 2024-04-10 ENCOUNTER — Ambulatory Visit: Admitting: Neurology

## 2024-12-21 ENCOUNTER — Ambulatory Visit: Admitting: Neurology

## 2025-01-16 ENCOUNTER — Ambulatory Visit: Admitting: Neurology
